# Patient Record
Sex: Female | Born: 1960 | Race: Black or African American | Hispanic: No | Smoking: Never smoker
Health system: Southern US, Community
[De-identification: ages and names within clinical notes are randomized; demographics above are authoritative.]

## PROBLEM LIST (undated history)

## (undated) DIAGNOSIS — I2510 Atherosclerotic heart disease of native coronary artery without angina pectoris: Secondary | ICD-10-CM

## (undated) DIAGNOSIS — E079 Disorder of thyroid, unspecified: Secondary | ICD-10-CM

## (undated) DIAGNOSIS — I10 Essential (primary) hypertension: Secondary | ICD-10-CM

## (undated) HISTORY — PX: TUBAL LIGATION: SHX77

## (undated) HISTORY — PX: ABDOMINAL HYSTERECTOMY: SHX81

## (undated) HISTORY — PX: BREAST BIOPSY: SHX20

## (undated) HISTORY — DX: Disorder of thyroid, unspecified: E07.9

## (undated) HISTORY — DX: Essential (primary) hypertension: I10

---

## 1998-01-05 ENCOUNTER — Other Ambulatory Visit: Admission: RE | Admit: 1998-01-05 | Discharge: 1998-01-05 | Payer: Self-pay | Admitting: Family Medicine

## 1998-01-05 ENCOUNTER — Other Ambulatory Visit: Admission: RE | Admit: 1998-01-05 | Discharge: 1998-01-05 | Payer: Self-pay

## 1998-07-12 ENCOUNTER — Encounter: Payer: Self-pay | Admitting: Obstetrics and Gynecology

## 1998-07-12 ENCOUNTER — Inpatient Hospital Stay (HOSPITAL_COMMUNITY): Admission: AD | Admit: 1998-07-12 | Discharge: 1998-07-12 | Payer: Self-pay | Admitting: Obstetrics and Gynecology

## 1998-08-27 ENCOUNTER — Ambulatory Visit (HOSPITAL_COMMUNITY): Admission: RE | Admit: 1998-08-27 | Discharge: 1998-08-27 | Payer: Self-pay | Admitting: Obstetrics and Gynecology

## 1998-09-02 ENCOUNTER — Inpatient Hospital Stay (HOSPITAL_COMMUNITY): Admission: AD | Admit: 1998-09-02 | Discharge: 1998-09-02 | Payer: Self-pay | Admitting: Obstetrics & Gynecology

## 1998-09-19 ENCOUNTER — Inpatient Hospital Stay (HOSPITAL_COMMUNITY): Admission: AD | Admit: 1998-09-19 | Discharge: 1998-09-19 | Payer: Self-pay | Admitting: Obstetrics and Gynecology

## 1998-09-27 ENCOUNTER — Inpatient Hospital Stay (HOSPITAL_COMMUNITY): Admission: AD | Admit: 1998-09-27 | Discharge: 1998-09-27 | Payer: Self-pay | Admitting: *Deleted

## 1998-10-11 ENCOUNTER — Inpatient Hospital Stay (HOSPITAL_COMMUNITY): Admission: AD | Admit: 1998-10-11 | Discharge: 1998-10-11 | Payer: Self-pay | Admitting: Obstetrics and Gynecology

## 1998-10-12 ENCOUNTER — Inpatient Hospital Stay (HOSPITAL_COMMUNITY): Admission: AD | Admit: 1998-10-12 | Discharge: 1998-10-12 | Payer: Self-pay | Admitting: Obstetrics and Gynecology

## 1998-10-19 ENCOUNTER — Inpatient Hospital Stay (HOSPITAL_COMMUNITY): Admission: AD | Admit: 1998-10-19 | Discharge: 1998-10-22 | Payer: Self-pay | Admitting: *Deleted

## 1998-10-19 ENCOUNTER — Inpatient Hospital Stay (HOSPITAL_COMMUNITY): Admission: AD | Admit: 1998-10-19 | Discharge: 1998-10-19 | Payer: Self-pay | Admitting: Obstetrics and Gynecology

## 1998-10-21 ENCOUNTER — Encounter: Payer: Self-pay | Admitting: *Deleted

## 1998-10-26 ENCOUNTER — Inpatient Hospital Stay (HOSPITAL_COMMUNITY): Admission: AD | Admit: 1998-10-26 | Discharge: 1998-10-26 | Payer: Self-pay | Admitting: Obstetrics and Gynecology

## 1998-10-30 ENCOUNTER — Inpatient Hospital Stay (HOSPITAL_COMMUNITY): Admission: AD | Admit: 1998-10-30 | Discharge: 1998-10-30 | Payer: Self-pay | Admitting: Obstetrics

## 1998-11-05 ENCOUNTER — Observation Stay (HOSPITAL_COMMUNITY): Admission: AD | Admit: 1998-11-05 | Discharge: 1998-11-06 | Payer: Self-pay | Admitting: *Deleted

## 1998-11-05 ENCOUNTER — Inpatient Hospital Stay (HOSPITAL_COMMUNITY): Admission: AD | Admit: 1998-11-05 | Discharge: 1998-11-05 | Payer: Self-pay | Admitting: *Deleted

## 1998-11-07 ENCOUNTER — Inpatient Hospital Stay (HOSPITAL_COMMUNITY): Admission: AD | Admit: 1998-11-07 | Discharge: 1998-11-10 | Payer: Self-pay | Admitting: *Deleted

## 1998-12-14 ENCOUNTER — Other Ambulatory Visit: Admission: RE | Admit: 1998-12-14 | Discharge: 1998-12-14 | Payer: Self-pay | Admitting: Obstetrics & Gynecology

## 1999-02-18 ENCOUNTER — Ambulatory Visit (HOSPITAL_COMMUNITY): Admission: RE | Admit: 1999-02-18 | Discharge: 1999-02-18 | Payer: Self-pay | Admitting: Obstetrics & Gynecology

## 1999-02-24 ENCOUNTER — Emergency Department (HOSPITAL_COMMUNITY): Admission: EM | Admit: 1999-02-24 | Discharge: 1999-02-24 | Payer: Self-pay | Admitting: Emergency Medicine

## 2000-06-27 ENCOUNTER — Emergency Department (HOSPITAL_COMMUNITY): Admission: EM | Admit: 2000-06-27 | Discharge: 2000-06-27 | Payer: Self-pay | Admitting: Emergency Medicine

## 2000-06-27 ENCOUNTER — Encounter: Payer: Self-pay | Admitting: Emergency Medicine

## 2000-12-21 ENCOUNTER — Ambulatory Visit (HOSPITAL_COMMUNITY): Admission: RE | Admit: 2000-12-21 | Discharge: 2000-12-21 | Payer: Self-pay | Admitting: Orthopedic Surgery

## 2000-12-21 ENCOUNTER — Encounter: Payer: Self-pay | Admitting: Orthopedic Surgery

## 2001-10-10 ENCOUNTER — Ambulatory Visit (HOSPITAL_COMMUNITY): Admission: RE | Admit: 2001-10-10 | Discharge: 2001-10-10 | Payer: Self-pay | Admitting: Obstetrics & Gynecology

## 2002-03-27 ENCOUNTER — Ambulatory Visit (HOSPITAL_COMMUNITY): Admission: RE | Admit: 2002-03-27 | Discharge: 2002-03-27 | Payer: Self-pay | Admitting: Obstetrics and Gynecology

## 2002-03-27 ENCOUNTER — Encounter: Payer: Self-pay | Admitting: Obstetrics and Gynecology

## 2002-05-18 ENCOUNTER — Emergency Department (HOSPITAL_COMMUNITY): Admission: EM | Admit: 2002-05-18 | Discharge: 2002-05-18 | Payer: Self-pay | Admitting: Emergency Medicine

## 2002-09-24 ENCOUNTER — Inpatient Hospital Stay (HOSPITAL_COMMUNITY): Admission: AD | Admit: 2002-09-24 | Discharge: 2002-09-24 | Payer: Self-pay | Admitting: Obstetrics and Gynecology

## 2002-09-24 ENCOUNTER — Encounter: Payer: Self-pay | Admitting: Obstetrics and Gynecology

## 2003-01-02 ENCOUNTER — Inpatient Hospital Stay (HOSPITAL_COMMUNITY): Admission: EM | Admit: 2003-01-02 | Discharge: 2003-01-03 | Payer: Self-pay | Admitting: Emergency Medicine

## 2003-01-02 ENCOUNTER — Encounter: Payer: Self-pay | Admitting: Emergency Medicine

## 2007-02-12 ENCOUNTER — Observation Stay (HOSPITAL_COMMUNITY): Admission: AD | Admit: 2007-02-12 | Discharge: 2007-02-14 | Payer: Self-pay | Admitting: Cardiology

## 2007-06-14 ENCOUNTER — Encounter: Admission: RE | Admit: 2007-06-14 | Discharge: 2007-06-14 | Payer: Self-pay | Admitting: Obstetrics & Gynecology

## 2011-01-10 NOTE — Cardiovascular Report (Signed)
NAMEKENORA, SPAYD              ACCOUNT NO.:  [id number]   MEDICAL RECORD NO.:  [id number]          PATIENT TYPE:  INP   LOCATION:  2023                         FACILITY:  [facility]   PHYSICIAN:  Georga Hacking, M.D.DATE OF BIRTH:  06-02-1961   DATE OF PROCEDURE:  02/13/2007  DATE OF DISCHARGE:                            CARDIAC CATHETERIZATION   HISTORY:  [age]-year-old female who presented with prolonged jaw,  arm and chest discomfort that was prolonged.  EKG was unremarkable and  pain was significantly improved with nitroglycerin.  Initial troponin  returned positive at 0.44, but MB was negative.  Catheterization was  advised to exclude significant coronary artery disease.   PROCEDURE:  Left heart catheterization with coronary angiograms and left  ventriculogram.   COMMENTS ABOUT PROCEDURE:  The patient tolerated the procedure well  without complications.  The right femoral artery was entered using a  single anterior needle wall stick.  She was taken to the holding area  for removal of the sheath following the procedure.   HEMODYNAMIC DATA:  Aorta post contrast 126/80.  LV post contrast 126/0-  11.   ANGIOGRAPHIC DATA:   LEFT VENTRICULOGRAM:  Performed in the 30-degree RAO projection.  There  was catheter-induced ventricular ectopy noted during the ventriculogram.  Wall motion appeared normal.  Ejection fraction appeared lower limits of  normal and estimated around 50%.   CORONARY ARTERIES:  Arise and distribute normally.  There was no  coronary calcification noted.  The left main coronary artery was normal.   Left anterior descending:  Arises normally.  It extends to the apex.  There was mild irregularity in proximal and possible mild systolic  bridging noted in the midportion of the LAD; the distal vessel was  moderately tortuous.   Circumflex coronary artery:  Large intermediate branch arises which is  free of disease.  The circumflex has 2 marginal arteries  which were  tortuous and appear free of disease.   The right coronary artery is a codominant vessel with the posterior  descending and a smaller posterolateral branch, which is moderately  tortuous and appears free of disease.   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   RECOMMENDATIONS:  It is possible the patient had an isolated episode of  coronary spasm versus small vessel disease to account for her abnormal  troponin and chest discomfort.  We will treat with a calcium channel  blocker with some negative inotropic effects such as verapamil and treat  with aspirin and Plavix for a few weeks.  Hopefully discharge in the  morning.      Georga Hacking, M.D.  Electronically Signed     WST/MEDQ  D:  02/13/2007  T:  02/14/2007  Job:  [id number]   cc:   Peyton Najjar, MD

## 2011-01-10 NOTE — Discharge Summary (Signed)
[name], [name]              ACCOUNT NO.:  [id number]   MEDICAL RECORD NO.:  [id number]          PATIENT TYPE:  INP   LOCATION:  2023                         FACILITY:  [facility]   PHYSICIAN:  Georga Hacking, M.D.DATE OF BIRTH:  1961/08/17   DATE OF ADMISSION:  02/12/2007  DATE OF DISCHARGE:  02/14/2007                               DISCHARGE SUMMARY   FINAL DIAGNOSES:  1. Prolonged chest discomfort and jaw and arm discomfort consistent      with acute coronary syndrome with mild elevation of troponin.  2. Hypertension.  3. Obesity.  4. Hypothyroidism under treatment.  5. Mild elevation of LDL cholesterol.   PROCEDURES:  CT angiogram of chest, cardiac catheterization, 2-D  echocardiogram.   HISTORY OF PRESENT ILLNESS:  The patient is a 50 year old black female  who has a history of obesity and hypertension for several years.  She  had returned early from a tour of [place] due to a daughter who is pregnant  in high school.  She has normally not had any chest discomfort, but  after cooking dinner the evening prior to admission became somewhat  sweaty and developed a pressure type feeling in her left upper chest  area and had associated discomfort in the left side of her jaw as well  as her shoulder and wrist area.  She felt as if the wrist discomfort was  worse and definitely previous discomfort associated with carpal tunnel  syndrome.  The discomfort may have been mildly pruritic the evening  prior to admission, but it was constant.  At one point considered going  to the emergency room.  The discomfort never completely went away and  was present again the morning of admission when she awoke and she was  found in urgent medical care.  Dr. Alwyn Ren asked me to evaluate her in  the office.  She continued to complain of associated pain in the left  side of her jaw, wrist, shoulder ,and upper chest area but not worse  with exertion.  She has not previously had other cardiac symptoms.   Has  been taking Celebrex for some arthritis.  She was admitted to the  hospital to rule out a myocardial infarction.   PAST MEDICAL HISTORY:  Remarkable for hypertension, obesity, and  hypothyroidism.   PAST SURGERIES:  Tubal ligation.   ALLERGIES:  PENICILLIN.   MEDICATIONS ON ADMISSION:  Celebrex, Synthroid, and HCTZ.   LABORATORY DATA:  Cholesterol 193, LDL cholesterol 130, HDL 49,  triglycerides 70.  CPK 159 with MB 3.7 with troponin 0.44.  Next  troponin was 0.32.  Metabolic panel shows sodium 139, potassium 3.4,  chloride 100, glucose 100.  Normal liver enzymes.  TSH was normal.   CT angiogram of the chest on the day of admission showed mild basilar  atelectasis, mild diffuse fatty infiltration of the liver, no pulmonary  embolus, and aortic dissection.  EKGs were normal.   The patient was admitted and placed on intravenous heparin and given  nitroglycerin which resulted in relief of her discomfort.  Her EKG  remained normal, and her troponin was mildly elevated.  Because relief  of the pain with nitroglycerin as well as elevated troponins she was  taken to the cardiac catheterization laboratory the next morning. There  was no coronary calcification noted.  Coronary arteries were tortuous  and were not felt to contain significant atherosclerotic disease.  There  was no coronary calcification noted.  There was thought to be mild  systolic bridging in the midportion of the LAD, and she also had a small  posterolateral branch off of the right coronary artery.  An  echocardiogram showed ejection fraction with lower limits of normal, and  there was mild LVH.  Valves appeared normal.   The results were discussed with the patient and her husband.  It was  thought because of her elevated troponins that she may have had an  ischemic episode that was not associated with long-term myocardial  damage.  It was thought this could have either been due to coronary  spasm, small vessel  disease or possibility of an event associated  myocardial bridging.   She will be treated with verapamil 240 mg daily to reduce coronary spasm  as well as to help negative inotropic effects for the myocardial bridge.  She is instructed to discontinue Celebrex.  She is to take aspirin 81 mg  daily and was instructed to lose weight and to exercise regularly.  She  may return to work tomorrow.  She was also given a p.r.n. prescription  for nitroglycerin to use.  She should call if she has recurrent  symptoms, and I will see her back in follow-up in the office in one  week.      Georga Hacking, M.D.  Electronically Signed     WST/MEDQ  D:  02/14/2007  T:  02/14/2007  Job:  [id number]   cc:   Peyton Najjar, MD

## 2011-01-13 NOTE — Consult Note (Signed)
NAME:  [name], [name]                        ACCOUNT NO.:  [id number]   MEDICAL RECORD NO.:  [id number]                   PATIENT TYPE:  MAT   LOCATION:  [facility]                                 FACILITY:  [facility]   PHYSICIAN:  Michelle L. Vincente Poli, M.D.            DATE OF BIRTH:  [birth year]   DATE OF CONSULTATION:  09/24/2002  DATE OF DISCHARGE:                                   CONSULTATION   HISTORY:  This is a 50 year old gravida __________  LMP two years ago who  has had a tubal ligation who presents with right lower quadrant pain.  She  has had a history of chronic right lower quadrant pain.  She has had an  examination which involved a laparoscopy in the past which was negative for  endometriosis.  She has also had an IVP which was negative for renal stone.  She has been treated with multiple narcotics in the past.  She says the pain  is intermittent.  She says that sometimes it lasts for a week at a time and  it is always localized to the right lower quadrant.  She does report some  difficulty sometimes with urination.   PHYSICAL EXAMINATION:  VITAL SIGNS:  Temperature 99.0, repeat 98.4, pulse  108, respirations 20, blood pressure 102/61.  ABDOMEN:  Soft.  She has some focal tenderness in right lower quadrant.  No  rebound or guarding.  PELVIC:  External genitalia within normal limits.  Vagina appears normal.  There is scant amount of discharge.  Cervix appears within normal limits.  On bimanual examination there is no cervical motion tenderness.  She has no  tenderness along the levators; however she does have localized tenderness to  the bladder trigone, especially on the right side.  Uterus appeared normal,  mobile, and nontender with no adnexal masses.   LABORATORIES:  Ultrasound is reviewed.  She has a normal sized uterus, a 1  cm right ovarian cyst, and a normal left ovary.  No adnexal mass or free  fluid is noted.  A quantitative hCG is less than 2.  White blood cell  count  is within normal limits with a white blood cell count of 7.9 and a  hemoglobin of 12.1.  Urinalysis is negative.   IMPRESSION:  Chronic pelvic pain.   PLAN:  The patient possibly could have interstitial cystitis.  I recommend  follow-up with a urologist.  Will refer to Jamison Neighbor, M.D. for  evaluation.  In the meantime the patient was discharged home with Tylox as  well as Vioxx 50 mg daily.  A GC, Chlamydia, and wet prep were performed  today.                                               Michelle L.  Vincente Poli, M.D.    Florestine Avers  D:  09/24/2002  T:  09/24/2002  Job:  [id number]

## 2011-01-13 NOTE — Op Note (Signed)
[facility]  Patient:    RIO, TABER Visit Number: [id number] MRN: [id number]          Service Type: Attending:  Freddy Finner, M.D. Dictated by:   Freddy Finner, M.D. Proc. Date: 10/10/01                             Operative Report  PREOPERATIVE DIAGNOSES:       1. Known uterine enlargement.                               2. Persistent right lower quadrant, right                                  pelvic pain.  POSTOPERATIVE DIAGNOSIS:      1. Known uterine enlargement.                               2. Persistent right lower quadrant, right                                  pelvic pain.                               3. No evidence of intrapelvic pathology other                                  than slightly irregular enlargement of the                                  uterus.  OPERATIONS:                   1. Laparoscopy.                               2. Uterosacral nerve ablation.                               3. Coagulation of right proximal tubal stump.                                  Obvious previous surgical sterilization with                                  Filshie clip still in place on the right                                  side with abrasion of the tube, suggesting                                  perhaps that the clip  had abraded off the                                  tube.  SURGEON:                      Freddy Finner, M.D.  ANESTHESIA:                   General.  ESTIMATED BLOOD LOSS:         10 cc.  INTRAOPERATIVE COMPLICATIONS: None.  INDICATIONS:                  The patient is a 50 year old who had previously had surgical sterilization in 2000.  At that time the uterus was noted to be slightly enlarged, consistent with probable leiomyomata.  Subsequent to that time she has been treated with oral contractive for menorrhagia.  In the fairly recent past she presented complaining of persistent right adnexal pelvic  pain.  Pelvic ultrasound revealed no abnormality of either ovary.  It was suspected that she had adhesions and/or pain from the uterus, and she was admitted at this time for laparoscopy.  FINDINGS:                     Basically as noted in the postoperative diagnoses, and photographs were made and retained in the office record.  DESCRIPTION OF PROCEDURE:     The patient was admitted on the morning of surgery, brought to the operating room, and placed under adequate general endotracheal anesthesia, placed in the dorsal lithotomy position using the Allen stirrups.  Betadine prep to the perineum and vagina was carried out in the usual fashion.  The bladder was emptied with a Robinson catheter.  A Hulka tenaculum was attached to the cervix.  Sterile drapes were applied.  Two small incisions were made, one at the umbilicus and one just above the symphysis. An 11 mm trocar was introduced to the umbilicus, and direct inspection revealed adequate placement with no evidence of injury on entry. Pneumoperitoneum was allowed to accumulate with carbon dioxide gas.  A 5 mm trocar was placed under direct visualization through the lower incision.  A blunt probe was used to manipulate pelvic structures.  A systematic examination of the pelvic and abdominal contents was carried out.  Findings were as noted above.  The appendix was noted to be normal.  No apparent abnormality was noted in the upper abdomen.  The uterus was put on stretch with the Hulka tenaculum, and the uterosacrals were fulgurated with bipolar forceps at their junction with the cervix.  The proximal end of the fallopian tube on the right side was fulgurated to a more certain occlusion.  There was no intra-abdominal bleeding.  At this point the procedure was terminated, gas was allowed to escape from the abdomen.  Skin incisions were closed with interrupted subcuticular sutures of 3-0 Dexon, and Steri-Strips were applied through the lower  incision.  Marcaine 0.25% plain was injected into the incision sites for postoperative analgesia.  The patient was given 30 mg of Toradol IV at the conclusion of the procedure.  The patient was awakened and taken to recovery in good condition. Dictated by:   Freddy Finner, M.D. Attending:  Freddy Finner, M.D. DD:  10/10/01 TD:  10/10/01 Job: [id number]

## 2011-01-13 NOTE — Discharge Summary (Signed)
   NAME:  [name], [name]                        ACCOUNT NO.:  [id number]   MEDICAL RECORD NO.:  [id number]                   PATIENT TYPE:  INP   LOCATION:  3743                                 FACILITY:  [facility]   PHYSICIAN:  Noelle C. Merilynn Finland, M.D.           DATE OF BIRTH:  [birth year]   DATE OF ADMISSION:  01/02/2003  DATE OF DISCHARGE:  01/03/2003                                 DISCHARGE SUMMARY   ADDENDUM:   PROBLEM LIST:  1. Continued chest pain, infarct ruled out with enzymes.  Fasting lipid     panel pending at time of dictation.  Patient needs risk stratification as     an outpatient.  2. Hypertension, controlled on HCTZ.  3. Question migraine headache with no triptans in the setting of chest pain.     Continue over-the-counter allergy and sinus medicine and follow up as an     outpatient.   DISPOSITION:  Patient to follow up at [facility] in one to two  weeks.                                               Noelle C. Merilynn Finland, M.D.    NCR/MEDQ  D:  02/18/2003  T:  02/20/2003  Job:  [id number]   cc:   Harrel Lemon. Merla Riches, M.D.  [address]  [place]  [place] [address]  Fax: [phone]    cc:   Harrel Lemon. Merla Riches, M.D.  [address]  [place]  [place] [address]  Fax: [phone]

## 2011-01-13 NOTE — Discharge Summary (Signed)
   NAME:  [name], [name]                        ACCOUNT NO.:  [id number]   MEDICAL RECORD NO.:  [id number]                   PATIENT TYPE:  INP   LOCATION:  3743                                 FACILITY:  [facility]   PHYSICIAN:  Noelle C. Merilynn Finland, M.D.           DATE OF BIRTH:  02-Feb-1961   DATE OF ADMISSION:  01/02/2003  DATE OF DISCHARGE:  01/03/2003                                 DISCHARGE SUMMARY   DISCHARGE DIAGNOSES:  1. Chest pain, ruled out myocardial infarction.  2. Hypertension.  3. Headache.   DISCHARGE MEDICATIONS:  1. Hydrochlorothiazide 12.5 mg p.o. every day.  2. Aspirin 81 mg p.o. every day.  3. Imitrex injector 6 mg p.r.n. migraines.  4. Multivitamin every day.   PRIMARY PHYSICIAN:  [facility], Dr. Merla Riches.   CONSULTS AND PROCEDURES:  None.   BRIEF HOSPITAL COURSE:  This is a 50 year old African American female who  was in her usual state of good health until the morning of admission when  she awakened from sleep with onset of sharp, squeezing, substernal chest  pain, radiating into her left arm, associated with dyspnea and pleuritic  symptoms, as well as nausea and diaphoresis.   PROBLEM #1:  Chest pain.  Cardiac risk factors include hypertension, family  history (her mother died at age 95 of an MI), as well as perimenopausal  state and sedentary lifestyle.   DICTATION ENDED HERE                                               Noelle C. Merilynn Finland, M.D.    NCR/MEDQ  D:  01/03/2003  T:  01/05/2003  Job:  [id number]   cc:   Dr. Merla Riches

## 2011-01-13 NOTE — H&P (Signed)
NAMECLYDETTE, [name]                        ACCOUNT NO.:  [id number]   MEDICAL RECORD NO.:  [id number]                   PATIENT TYPE:  INP   LOCATION:  3743                                 FACILITY:  [facility]   PHYSICIAN:  Ocie Doyne, M.D.                 DATE OF BIRTH:  [birth year]   DATE OF ADMISSION:  01/02/2003  DATE OF DISCHARGE:  01/03/2003                                HISTORY & PHYSICAL   CHIEF COMPLAINT:  My chest hurts and my left arm is tingling.   HISTORY OF PRESENT ILLNESS:  A 50 year old African American female has been  in her usual state of good health until 5 a.m. this morning she was awakened  from sleep by the onset of sharp, squeezing, substernal chest pain which  gradually increased in intensity and radiated down the left arm into her  hand.  She also complained of dyspnea discomfort with deep inspiration,  nausea and diaphoresis.  She complained of tingling in her hands and feet  and was noted by the emergency department staff to be hyperventilating on  arrival.  She denies vertigo, syncope, waterbrash, cough or fever.  Her  husband called 911 and on arrival to the emergency department the chest pain  was a 7 out of 10 in intensity.  After sublingual nitroglycerin her pain  decreased to a 4 out of 10 and then a 2 out of 10.  She continued to have  some discomfort during my exam.  She described several similar episodes  approximately three years ago with the pain, diaphoresis and left arm pain.  She was evaluated in the emergency department but was not admitted and no  further cardiac work up was done at that time.  She did recently undergo an  EKG to evaluate fatigue which was normal  by her report.  She has never had  a cardiac stress test.  Her lipid status is unknown.  Cardiac risk factors  include a sedentary lifestyle, hypertension, perimenopausal and a strong  family history of cardiac disease.  Her mother died at the age of 60 of MI  and her father  has been diagnosed with severe cardiac atherosclerosis.   REVIEW OF SYSTEMS:  As above.  Also she has recently suffered from fatigue  and reports she had a mildly elevated TSH approximately three weeks ago.  She has had three days of mild headache and subjective swelling in her hands  and feet.  Review of systems is otherwise negative.   MEDICATIONS:  1. Hydrochlorothiazide 12.5 mg p.o. daily.  2. Imitrex injector 6 mg subcu p.r.n. headache.  3. Women's One a Day multivitamin.  4. Tylenol p.r.n.   ALLERGIES:  PENICILLIN causes tongue swelling.   PAST MEDICAL HISTORY:  1. She has had hypertension diagnosed one year ago.  2. Occasional migraine headaches, less than one a month.  3. She was recently diagnosed with mild anemia.  4. She is perimenopausal.  Has been amenorrheic for two years.  5. Three spontaneous vaginal deliveries, uncomplicated except for some     preterm contractions.  6. She had a tubal ligation in 2000.  7. She has had no other surgeries or hospitalizations.   SOCIAL HISTORY:  She is married with three children.  She works as a  Personnel officer.  Has a sedentary lifestyle and does not eat well.  She  denies tobacco use ever and rarely drinks alcohol.   FAMILY HISTORY:  Her mother is deceased of a massive MI at the age of 47 and  was hypertensive.  Father has severe coronary atherosclerosis but otherwise  healthy.  One brother is healthy.  Great aunt has diabetes.  Paternal  grandmother and paternal grandfather have hypertension.  One cousin has  renal cancer.  There is no history of stroke and no other pertinent medical  family history.   ADMISSION EXAMINATION:  VITAL SIGNS:  Temp 97.0, blood pressure 111/59,  heart rate 93, respiratory rate 24, oxygen saturation 100% on room air.  GENERAL:  She was in no acute distress.  Alert and pleasant.  HEENT:  Pupils equal, round, reactive to light.  Extraocular movements  intact.  Nose and oropharynx are within normal  limits with no lesions or  erythema.  NECK:  Supple.  No lymphadenopathy.  No thyromegaly.  HEART:  Regular rate and rhythm.  No murmur, rub or gallop.  No carotid  bruits.  LUNGS:  Clear to auscultation.  No wheezes.  No crackles.  ABDOMEN:  Soft, nontender, nondistended.  Normal  bowel sounds.  No masses.  No organomegaly.  MUSCULOSKELETAL:  Strength is 5/5 bilaterally in the upper and lower  extremities.  Chest wall was tender to palpation on the left side and  reproduced the pain that she had complained of earlier.  Lower extremities  trace non-pitting edema at the ankles, very minimal.  No skin lesions were  noted.   ADMISSION LABORATORY:  Sodium 141, potassium 3.6, chloride 110, BUN 9,  creatinine 0.7, glucose 102.  WBCs 4.9, hemoglobin 11.7, platelets 299.  Normal  differential.  D-dimmer 0.78.   Chest film no acute disease.   EKG normal sinus rhythm, no Q waves, no ST/T elevation.   Total CK 158, CK-MB 0.8, troponin I of 0.01.   ASSESSMENT/PLAN:  A 50 year old [place] American female with chest pain,  somewhat atypical in presentation which was relieved with nitroglycerin,  having multiple cardiac risk factors.  1. Chest pain.  Patient will be admitted to telemetry to rule out myocardial     infarction with serial cardiac enzymes and EKG.  At presentation she has     no EKG changes and cardiac enzymes are normal.  Aspirin and nitroglycerin     were given in the emergency department.  She will be on a cardiac diet.     Her risk factors include family history, hypertension and her     perimenopausal status as well as sedentary lifestyle.  The     reproducibility of the pain with chest wall palpation is somewhat     atypical.  We will check a fasting lipid panel in the morning.  If she     rules out for myocardial infarction we will plan to proceed with a    patient risk stratification.  She is a good candidate for exercise stress     test.  Mild elevation in D-dimmer was  noted but her oxygen  saturation on     room air is completely normal.  She was not dyspneic on exam and     pulmonary embolism seems unlikely on the differential.  2. Hypertension.  She is currently at her goal blood pressure on     hydrochlorothiazide.  Will continue current medications and follow her     blood pressure.  If any evidence of cardiac ischemia shows in her lab     tests or by EKG would switch to a beta-blocker for a stronger     cardioprotective effect.  3.     Migraines.  Currently she has a headache due to use of nitroglycerin.  This      is not a migraine but we will make her Imitrex available during     hospitalization.  I would however, be very alert to the possibility of     coronary vasospasm if she develops worsening chest pain when this is     used.                                               Ocie Doyne, M.D.    CY/MEDQ  D:  01/02/2003  T:  01/04/2003  Job:  [id number]   cc:   Harrel Lemon. Merla Riches, M.D.  [address]  [place]  [place] [address]  Fax: [phone]

## 2011-06-14 LAB — CBC
HCT: 36.7
MCHC: 33.4
MCV: 86.8
Platelets: 307
RDW: 13.9
WBC: 9

## 2011-06-14 LAB — LIPID PANEL
Cholesterol: 193
LDL Cholesterol: 130 — ABNORMAL HIGH
VLDL: 14

## 2011-06-14 LAB — TSH: TSH: 3.139

## 2011-06-14 LAB — HEPARIN LEVEL (UNFRACTIONATED): Heparin Unfractionated: 0.89 — ABNORMAL HIGH

## 2011-06-14 LAB — COMPREHENSIVE METABOLIC PANEL
Albumin: 3.7
Alkaline Phosphatase: 90
BUN: 10
Calcium: 9.4
Potassium: 3.4 — ABNORMAL LOW
Total Protein: 7.5

## 2011-06-14 LAB — CARDIAC PANEL(CRET KIN+CKTOT+MB+TROPI)
CK, MB: 3.7
Relative Index: 2.3
Total CK: 124
Total CK: 159
Troponin I: 0.32 — ABNORMAL HIGH

## 2011-06-14 LAB — PROTIME-INR
INR: 1.1
Prothrombin Time: 14.3

## 2011-06-14 LAB — APTT: aPTT: 33

## 2011-08-05 ENCOUNTER — Emergency Department (HOSPITAL_COMMUNITY)
Admission: EM | Admit: 2011-08-05 | Discharge: 2011-08-06 | Discharge status: Against Medical Advice | Payer: BC Managed Care – PPO | Attending: Emergency Medicine | Admitting: Emergency Medicine

## 2011-08-05 ENCOUNTER — Other Ambulatory Visit: Payer: Self-pay

## 2011-08-05 ENCOUNTER — Encounter: Payer: Self-pay | Admitting: *Deleted

## 2011-08-05 DIAGNOSIS — R079 Chest pain, unspecified: Secondary | ICD-10-CM | POA: Insufficient documentation

## 2011-08-05 HISTORY — DX: Atherosclerotic heart disease of native coronary artery without angina pectoris: I25.10

## 2011-08-05 NOTE — ED Notes (Signed)
Pt watching tv and developed midsternal chest pain radiating to left side; diaphoretic prior to arrival; pain with deep inspiration

## 2012-04-25 ENCOUNTER — Encounter: Payer: Self-pay | Admitting: Family Medicine

## 2012-04-25 ENCOUNTER — Ambulatory Visit (INDEPENDENT_AMBULATORY_CARE_PROVIDER_SITE_OTHER): Payer: BC Managed Care – PPO | Admitting: Family Medicine

## 2012-04-25 VITALS — BP 133/87 | HR 99 | Temp 98.1°F | Resp 17 | Ht 64.0 in | Wt 226.0 lb

## 2012-04-25 DIAGNOSIS — F439 Reaction to severe stress, unspecified: Secondary | ICD-10-CM

## 2012-04-25 DIAGNOSIS — Z639 Problem related to primary support group, unspecified: Secondary | ICD-10-CM

## 2012-04-25 DIAGNOSIS — E669 Obesity, unspecified: Secondary | ICD-10-CM

## 2012-04-25 DIAGNOSIS — F4310 Post-traumatic stress disorder, unspecified: Secondary | ICD-10-CM

## 2012-04-25 DIAGNOSIS — I10 Essential (primary) hypertension: Secondary | ICD-10-CM

## 2012-04-25 NOTE — Patient Instructions (Signed)
Here are some of the steps that I recommend to get a weight loss program started-  Increase Effexor to 150 mg to help reduce symptoms of stress and anxiety that are aggravated by current home situation.                                                                                                                                         Get someone from the local [facility] facility to talk with your son and help him address his issues for transition into civilian life.                                                                                                                                          Inquire a the Y about a membership [employer]' discount) so you can get into Water aerobics class. Try to go 2 times a week to start.      Monitor the situations that trigger your stress eating and try to manage portions.     Diabetes Meal Planning Guide The diabetes meal planning guide is a tool to help you plan your meals and snacks. It is important for people with diabetes to manage their blood glucose (sugar) levels. Choosing the right foods and the right amounts throughout your day will help control your blood glucose. Eating right can even help you improve your blood pressure and reach or maintain a healthy weight. CARBOHYDRATE COUNTING MADE EASY When you eat carbohydrates, they turn to sugar. This raises your blood glucose level. Counting carbohydrates can help you control this level so you feel better. When you plan your meals by counting carbohydrates, you can have more flexibility in what you eat and balance your medicine with your food intake. Carbohydrate counting simply means adding up the total amount of carbohydrate grams in your meals and snacks. Try to eat about the same amount at each meal. Foods with carbohydrates are listed below. Each portion below is 1 carbohydrate serving or 15 grams of carbohydrates. Ask your dietician how many grams of carbohydrates you should eat at each meal or  snack. Grains and Starches  1 slice bread.    English muffin or hotdog/hamburger bun.    cup cold cereal (unsweetened).   ? cup cooked pasta or rice.    cup  starchy vegetables (corn, potatoes, peas, beans, winter squash).   1 tortilla (6 inches).    bagel.   1 waffle or pancake (size of a CD).    cup cooked cereal.   4 to 6 small crackers.  *Whole grain is recommended. Fruit  1 cup fresh unsweetened berries, melon, papaya, pineapple.   1 small fresh fruit.    banana or mango.    cup fruit juice (4 oz unsweetened).    cup canned fruit in natural juice or water.   2 tbs dried fruit.   12 to 15 grapes or cherries.  Milk and Yogurt  1 cup fat-free or 1% milk.   1 cup soy milk.   6 oz light yogurt with sugar-free sweetener.   6 oz low-fat soy yogurt.   6 oz plain yogurt.  Vegetables  1 cup raw or  cup cooked is counted as 0 carbohydrates or a "free" food.   If you eat 3 or more servings at 1 meal, count them as 1 carbohydrate serving.  Other Carbohydrates   oz chips or pretzels.    cup ice cream or frozen yogurt.    cup sherbet or sorbet.   2 inch square cake, no frosting.   1 tbs honey, sugar, jam, jelly, or syrup.   2 small cookies.   3 squares of graham crackers.   3 cups popcorn.   6 crackers.   1 cup broth-based soup.   Count 1 cup casserole or other mixed foods as 2 carbohydrate servings.   Foods with less than 20 calories in a serving may be counted as 0 carbohydrates or a "free" food.  You may want to purchase a book or computer software that lists the carbohydrate gram counts of different foods. In addition, the nutrition facts panel on the labels of the foods you eat are a good source of this information. The label will tell you how big the serving size is and the total number of carbohydrate grams you will be eating per serving. Divide this number by 15 to obtain the number of carbohydrate servings in a portion.  Remember, 1 carbohydrate serving equals 15 grams of carbohydrate. SERVING SIZES Measuring foods and serving sizes helps you make sure you are getting the right amount of food. The list below tells how big or small some common serving sizes are.  1 oz.........4 stacked dice.   3 oz........Marland KitchenDeck of cards.   1 tsp.......Marland KitchenTip of little finger.   1 tbs......Marland KitchenMarland KitchenThumb.   2 tbs.......Marland KitchenGolf ball.    cup......Marland KitchenHalf of a fist.   1 cup.......Marland KitchenA fist.  SAMPLE DIABETES MEAL PLAN Below is a sample meal plan that includes foods from the grain and starches, dairy, vegetable, fruit, and meat groups. A dietician can individualize a meal plan to fit your calorie needs and tell you the number of servings needed from each food group. However, controlling the total amount of carbohydrates in your meal or snack is more important than making sure you include all of the food groups at every meal. You may interchange carbohydrate containing foods (dairy, starches, and fruits). The meal plan below is an example of a 2000 calorie diet using carbohydrate counting. This meal plan has 17 carbohydrate servings. Breakfast  1 cup oatmeal (2 carb servings).    cup light yogurt (1 carb serving).   1 cup blueberries (1 carb serving).    cup almonds.  Snack  1 large apple (2 carb servings).   1 low-fat string cheese stick.  Lunch  Chicken breast salad.   1 cup spinach.    cup chopped tomatoes.   2 oz chicken breast, sliced.   2 tbs low-fat [place] dressing.   12 whole-wheat crackers (2 carb servings).   12 to 15 grapes (1 carb serving).   1 cup low-fat milk (1 carb serving).  Snack  1 cup carrots.    cup hummus (1 carb serving).  Dinner  3 oz broiled salmon.   1 cup brown rice (3 carb servings).  Snack  1  cups steamed broccoli (1 carb serving) drizzled with 1 tsp olive oil and lemon juice.   1 cup light pudding (2 carb servings).  DIABETES MEAL PLANNING WORKSHEET Your dietician can  use this worksheet to help you decide how many servings of foods and what types of foods are right for you.  BREAKFAST Food Group and Servings / Carb Servings Grain/Starches __________________________________ Dairy __________________________________________ Vegetable ______________________________________ Fruit ___________________________________________ Meat __________________________________________ Fat ____________________________________________ LUNCH Food Group and Servings / Carb Servings Grain/Starches ___________________________________ Dairy ___________________________________________ Fruit ____________________________________________ Meat ___________________________________________ Fat _____________________________________________ Laural Golden Food Group and Servings / Carb Servings Grain/Starches ___________________________________ Dairy ___________________________________________ Fruit ____________________________________________ Meat ___________________________________________ Fat _____________________________________________ SNACKS Food Group and Servings / Carb Servings Grain/Starches ___________________________________ Dairy ___________________________________________ Vegetable _______________________________________ Fruit ____________________________________________ Meat ___________________________________________ Fat _____________________________________________ DAILY TOTALS Starches _________________________ Vegetable ________________________ Fruit ____________________________ Dairy ____________________________ Meat ____________________________ Fat ______________________________ Document Released: 05/11/2005 Document Revised: 08/03/2011 Document Reviewed: 03/22/2009 [employer] Patient Information 2012 [place], [place].

## 2012-04-25 NOTE — Progress Notes (Signed)
S: This 52 y.o. 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 She would like to discuss weight loss, having struggled with  this since 2008 which is when she left the [employer].  Weight Watchers and OTC weight loss supplements have not been effective. Exercise is limited due to knee and ankle injuries.  She has talked with a nutritionist and understands portions ,   caloric intake, etc. Admits to being a "stress eater".    She has significant stressors at home with in complicating her efforts at weight loss. 23 y.o. son is > 1 year out of the [employer] and has mild PTSD (not in treatment, not employed and living with pt); also has adult daughter and her child living with  her  in an apartment which was initially meant for pt and her 21 y.o.son. She works as an Production designer, theatre/television/film with [employer].  ROS: No activity or appetite change other than outlined above; no fever or chills, CP or tightness, palpitations, SOB, edema, nausea or vomiting, change in stools, polyuria or polydipsia, HA, myalgias, tremor,numbness or weakness.           + anxiety, dysphoric mood, sleep disturbance.  O: Filed Vitals:   04/25/12 1403  BP: 133/87  Pulse: 99  Temp: 98.1 F (36.7 C)  Resp: 17  Weight unchanged from 1 year ago  GEN: In NAD HENT: [place]/AT; EOMMI, conj/scl clear. LUNGS: Normal resp rate and effort. COR: RRR NEURO: A&O x 3; CNs intact ; no deficits.  A/P: 1. Obesity, Class II, BMI 35-39.9     Advised joining aquatic exercise program; identify stress eating when it happens (keeping a food diary or journalling may help); observe sound nutrition practices  2. Post traumatic stress disorder (PTSD)      [facility]   3. Stress At Home      Contact local [facility] and find a mentor to intervene with son re: transition to civilian life (clear goals need to be  set by an objective outsider/ veteran and son needs to get into counseling to deal with PSTD  4. HTN (hypertension)    Continue current medication; encourage weight reduction in slow,reasonable responsible manner

## 2012-04-29 ENCOUNTER — Encounter: Payer: Self-pay | Admitting: Family Medicine

## 2012-04-29 DIAGNOSIS — E039 Hypothyroidism, unspecified: Secondary | ICD-10-CM | POA: Insufficient documentation

## 2012-04-29 DIAGNOSIS — F4310 Post-traumatic stress disorder, unspecified: Secondary | ICD-10-CM | POA: Insufficient documentation

## 2012-04-29 DIAGNOSIS — E66812 Obesity, class 2: Secondary | ICD-10-CM | POA: Insufficient documentation

## 2012-04-29 DIAGNOSIS — I10 Essential (primary) hypertension: Secondary | ICD-10-CM | POA: Insufficient documentation

## 2012-04-29 DIAGNOSIS — E669 Obesity, unspecified: Secondary | ICD-10-CM | POA: Insufficient documentation

## 2012-04-29 DIAGNOSIS — F439 Reaction to severe stress, unspecified: Secondary | ICD-10-CM | POA: Insufficient documentation

## 2012-04-29 DIAGNOSIS — G4733 Obstructive sleep apnea (adult) (pediatric): Secondary | ICD-10-CM | POA: Insufficient documentation

## 2012-05-23 ENCOUNTER — Encounter: Payer: Self-pay | Admitting: Family Medicine

## 2012-08-01 ENCOUNTER — Ambulatory Visit: Payer: BC Managed Care – PPO | Admitting: Family Medicine

## 2012-09-09 ENCOUNTER — Ambulatory Visit (INDEPENDENT_AMBULATORY_CARE_PROVIDER_SITE_OTHER): Payer: BC Managed Care – PPO | Admitting: Family Medicine

## 2012-09-09 VITALS — BP 136/92 | HR 109 | Temp 98.7°F | Resp 18 | Wt 232.0 lb

## 2012-09-09 DIAGNOSIS — K5289 Other specified noninfective gastroenteritis and colitis: Secondary | ICD-10-CM

## 2012-09-09 DIAGNOSIS — K529 Noninfective gastroenteritis and colitis, unspecified: Secondary | ICD-10-CM

## 2012-09-09 MED ORDER — METRONIDAZOLE 500 MG PO TABS: 500.0000 mg | ORAL_TABLET | Freq: Two times a day (BID) | ORAL | Status: DC

## 2012-09-09 NOTE — Progress Notes (Signed)
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  No other sx.  Objective:  NAD Abdomen: soft, nontender with no HSM, hyperactive BS Chest:  Clear Heart: reg, no murmur Skin:  Normal  Assessment:  norovirus type illness  1. Gastroenteritis  metroNIDAZOLE (FLAGYL) 500 MG tablet

## 2012-09-09 NOTE — Patient Instructions (Addendum)
Get some probiotics (eg activia, culturelle, align) Avoid greasy or fried food or dairy products for 2 days   Norovirus Infection Norovirus illness is caused by a viral infection. The term norovirus refers to a group of viruses. Any of those viruses can cause norovirus illness. This illness is often referred to by other names such as viral gastroenteritis, stomach flu, and food poisoning. Anyone can get a norovirus infection. People can have the illness multiple times during their lifetime. CAUSES  Norovirus is found in the stool or vomit of infected people. It is easily spread from person to person (contagious). People with norovirus are contagious from the moment they begin feeling ill. They may remain contagious for as long as 3 days to 2 weeks after recovery. People can become infected with the virus in several ways. This includes:  Eating food or drinking liquids that are contaminated with norovirus.  Touching surfaces or objects contaminated with norovirus, and then placing your hand in your mouth.  Having direct contact with a person who is infected and shows symptoms. This may occur while caring for someone with illness or while sharing foods or eating utensils with someone who is ill. SYMPTOMS  Symptoms usually begin 1 to 2 days after ingestion of the virus. Symptoms may include:  Nausea.  Vomiting.  Diarrhea.  Stomach cramps.  Low-grade fever.  Chills.  Headache.  Muscle aches.  Tiredness. Most people with norovirus illness get better within 1 to 2 days. Some people become dehydrated because they cannot drink enough liquids to replace those lost from vomiting and diarrhea. This is especially true for young children, the elderly, and others who are unable to care for themselves. DIAGNOSIS  Diagnosis is based on your symptoms and exam. Currently, only state public health laboratories have the ability to test for norovirus in stool or vomit. TREATMENT  No specific  treatment exists for norovirus infections. No vaccine is available to prevent infections. Norovirus illness is usually brief in healthy people. If you are ill with vomiting and diarrhea, you should drink enough water and fluids to keep your urine clear or pale yellow. Dehydration is the most serious health effect that can result from this infection. By drinking oral rehydration solution (ORS), people can reduce their chance of becoming dehydrated. There are many commercially available pre-made and powdered ORS designed to safely rehydrate people. These may be recommended by your caregiver. Replace any new fluid losses from diarrhea or vomiting with ORS as follows:  If your child weighs 10 kg or less (22 lb or less), give 60 to 120 ml ( to  cup or 2 to 4 oz) of ORS for each diarrheal stool or vomiting episode.  If your child weighs more than 10 kg (more than 22 lb), give 120 to 240 ml ( to 1 cup or 4 to 8 oz) of ORS for each diarrheal stool or vomiting episode. HOME CARE INSTRUCTIONS   Follow all your caregiver's instructions.  Avoid sugar-free and alcoholic drinks while ill.  Only take over-the-counter or prescription medicines for pain, vomiting, diarrhea, or fever as directed by your caregiver. You can decrease your chances of coming in contact with norovirus or spreading it by following these steps:  Frequently wash your hands, especially after using the toilet, changing diapers, and before eating or preparing food.  Carefully wash fruits and vegetables. Cook shellfish before eating them.  Do not prepare food for others while you are infected and for at least 3 days after recovering from  illness.  Thoroughly clean and disinfect contaminated surfaces immediately after an episode of illness using a bleach-based household cleaner.  Immediately remove and wash clothing or linens that may be contaminated with the virus.  Use the toilet to dispose of any vomit or stool. Make sure the  surrounding area is kept clean.  Food that may have been contaminated by an ill person should be discarded. SEEK IMMEDIATE MEDICAL CARE IF:   You develop symptoms of dehydration that do not improve with fluid replacement. This may include:  Excessive sleepiness.  Lack of tears.  Dry mouth.  Dizziness when standing.  Weak pulse. Document Released: 11/04/2002 Document Revised: 11/06/2011 Document Reviewed: 12/06/2009 [employer] Patient Information 2013 [place], [place].

## 2013-02-06 ENCOUNTER — Emergency Department (HOSPITAL_COMMUNITY): Payer: BC Managed Care – PPO

## 2013-02-06 ENCOUNTER — Emergency Department (HOSPITAL_COMMUNITY)
Admission: EM | Admit: 2013-02-06 | Discharge: 2013-02-06 | Disposition: A | Discharge status: Home / Self Care | Payer: BC Managed Care – PPO | Attending: Emergency Medicine | Admitting: Emergency Medicine

## 2013-02-06 ENCOUNTER — Encounter (HOSPITAL_COMMUNITY): Payer: Self-pay | Admitting: Adult Health

## 2013-02-06 DIAGNOSIS — R079 Chest pain, unspecified: Secondary | ICD-10-CM | POA: Insufficient documentation

## 2013-02-06 DIAGNOSIS — M940 Chondrocostal junction syndrome [Tietze]: Secondary | ICD-10-CM | POA: Insufficient documentation

## 2013-02-06 DIAGNOSIS — I10 Essential (primary) hypertension: Secondary | ICD-10-CM | POA: Insufficient documentation

## 2013-02-06 LAB — BASIC METABOLIC PANEL
BUN: 13 mg/dL (ref 6–23)
Creatinine, Ser: 0.92 mg/dL (ref 0.50–1.10)
GFR calc Af Amer: 83 mL/min — ABNORMAL LOW (ref >90)
GFR calc non Af Amer: 71 mL/min — ABNORMAL LOW (ref >90)
Glucose, Bld: 128 mg/dL — ABNORMAL HIGH (ref 70–99)

## 2013-02-06 LAB — CBC
HCT: 36.9 % (ref 36.0–46.0)
Hemoglobin: 12.5 g/dL (ref 12.0–15.0)
MCH: 29.8 pg (ref 26.0–34.0)
MCHC: 33.9 g/dL (ref 30.0–36.0)
MCV: 88.1 fL (ref 78.0–100.0)
RDW: 14.1 % (ref 11.5–15.5)

## 2013-02-06 LAB — POCT I-STAT TROPONIN I

## 2013-02-06 MED ORDER — MORPHINE SULFATE 4 MG/ML IJ SOLN
4.0000 mg | Freq: Once | INTRAMUSCULAR | Status: AC
  Administered 2013-02-06: 4 mg via INTRAVENOUS
  Filled 2013-02-06: qty 1

## 2013-02-06 MED ORDER — SODIUM CHLORIDE 0.9 % IV BOLUS (SEPSIS)
500.0000 mL | Freq: Once | INTRAVENOUS | Status: AC
  Administered 2013-02-06: 500 mL via INTRAVENOUS

## 2013-02-06 MED ORDER — ONDANSETRON HCL 4 MG/2ML IJ SOLN
4.0000 mg | Freq: Once | INTRAMUSCULAR | Status: AC
  Administered 2013-02-06: 4 mg via INTRAVENOUS
  Filled 2013-02-06: qty 2

## 2013-02-06 MED ORDER — ASPIRIN 325 MG PO TABS
325.0000 mg | ORAL_TABLET | ORAL | Status: AC
  Administered 2013-02-06: 325 mg via ORAL
  Filled 2013-02-06: qty 1

## 2013-02-06 MED ORDER — CYCLOBENZAPRINE HCL 10 MG PO TABS: 10.0000 mg | ORAL_TABLET | Freq: Two times a day (BID) | ORAL | Status: DC | PRN

## 2013-02-06 MED ORDER — IOHEXOL 350 MG/ML SOLN
100.0000 mL | Freq: Once | INTRAVENOUS | Status: AC | PRN
  Administered 2013-02-06: 100 mL via INTRAVENOUS

## 2013-02-06 MED ORDER — HYDROCODONE-ACETAMINOPHEN 5-325 MG PO TABS: 1.0000 | ORAL_TABLET | Freq: Three times a day (TID) | ORAL | Status: DC | PRN

## 2013-02-06 NOTE — ED Notes (Signed)
Melvenia Beam PA and Dr. Ignacia Palma at bedside.

## 2013-02-06 NOTE — ED Notes (Signed)
Pt called out sts having severe left sided chest pain radiating to left arm. Pt sts feeling lightheaded, denies SOB, nausea. Worsens with deep inspiration. Sharai PA made aware. Repeat EKG done adn 4mg  of morphine given.

## 2013-02-06 NOTE — Discharge Instructions (Signed)
Please call and set-up an appointment with Cardiology regarding chest pain Please call and set-up an appointment with Adult Care Clinic by the beginning of next week to be re-evaluated Please take medications as prescribed - while on pain medications there is to be no drinking alcohol, driving, operating heavy machinery - if there is any left over please flush down the toilet Please continue to rest and stay hydrated Please continue to monitor symptoms and if symptoms are to worsen or change (fever > 100, chills, sweating, worsening or changes to chest pain, increase in chest pain, numbness and/or tingling, dizziness, nausea, vomiting, stomach pain) please report back to the ED.   Chest Pain (Nonspecific) Chest pain has many causes. Your pain could be caused by something serious, such as a heart attack or a blood clot in the lungs. It could also be caused by something less serious, such as a chest bruise or a virus. Follow up with your doctor. More lab tests or other studies may be needed to find the cause of your pain. Most of the time, nonspecific chest pain will improve within 2 to 3 days of rest and mild pain medicine. HOME CARE  For chest bruises, you may put ice on the sore area for 15-20 minutes, 3-4 times a day. Do this only if it makes you or your child feel better.  Put ice in a plastic bag.  Place a towel between the skin and the bag.  Rest for the next 2 to 3 days.  Go back to work if the pain improves.  See your doctor if the pain lasts longer than 1 to 2 weeks.  Only take medicine as told by your doctor.  Quit smoking if you smoke. GET HELP RIGHT AWAY IF:   There is more pain or pain that spreads to the arm, neck, jaw, back, or belly (abdomen).  You or your child has shortness of breath.  You or your child coughs more than usual or coughs up blood.  You or your child has very bad back or belly pain, feels sick to his or her stomach (nauseous), or throws up  (vomits).  You or your child has very bad weakness.  You or your child passes out (faints).  You or your child has a temperature by mouth above 102 F (38.9 C), not controlled by medicine. Any of these problems may be serious and may be an emergency. Do not wait to see if the problems will go away. Get medical help right away. Call your local emergency services 911 in [place] Do not drive yourself to the hospital. MAKE SURE YOU:   Understand these instructions.  Will watch this condition.  Will get help right away if you or your child is not doing well or gets worse. Document Released: 01/31/2008 Document Revised: 11/06/2011 Document Reviewed: 01/31/2008 [facility] Patient Information 2014 [place], [place]. Costochondritis Costochondritis (Tietze syndrome), or costochondral separation, is a swelling and irritation (inflammation) of the tissue (cartilage) that connects your ribs with your breastbone (sternum). It may occur on its own (spontaneously), through damage caused by an accident (trauma), or simply from coughing or minor exercise. It may take up to 6 weeks to get better and longer if you are unable to be conservative in your activities. HOME CARE INSTRUCTIONS   Avoid exhausting physical activity. Try not to strain your ribs during normal activity. This would include any activities using chest, belly (abdominal), and side muscles, especially if heavy weights are used.  Use ice  for 15-20 minutes per hour while awake for the first 2 days. Place the ice in a plastic bag, and place a towel between the bag of ice and your skin.  Only take over-the-counter or prescription medicines for pain, discomfort, or fever as directed by your caregiver. SEEK IMMEDIATE MEDICAL CARE IF:   Your pain increases or you are very uncomfortable.  You have a fever.  You develop difficulty with your breathing.  You cough up blood.  You develop worse chest pains, shortness of breath, sweating, or  vomiting.  You develop new, unexplained problems (symptoms). MAKE SURE YOU:   Understand these instructions.  Will watch your condition.  Will get help right away if you are not doing well or get worse. Document Released: 05/24/2005 Document Revised: 11/06/2011 Document Reviewed: 04/01/2008 [facility] Patient Information 2014 [place], [place].  RESOURCE GUIDE  Chronic Pain Problems: Contact [facility]  [phone] Patients need to be referred by their primary care doctor.  Insufficient Money for Medicine: Contact United Way:  call "211."   No Primary Care Doctor: - Call Health Connect  [phone] - can help you locate a primary care doctor that  accepts your insurance, provides certain services, etc. - Physician Referral Service- [phone]  Agencies that provide inexpensive medical care: - [facility]  [phone] - [facility]  [phone] - [facility]  [phone] - Women's Clinic  [phone] - Planned Parenthood  [phone] [facility]  [phone]  Medicaid-accepting [facility] Providers: - [facility]- [address]  [phone], Mon-Fri 9am-7pm, Sat 9am-1pm - [facility]- [address] [place], [address] [place]  [phone] - [facility]- [address], Suite [place]  [phone] [facility]- [address]  [phone] - Renaye Rakers- [address] [place], [address], [phone]  Only accepts [place] patients after they have their name  applied to their card  Self Pay (no insurance) in [place]: - Sickle Cell Patients - [facility]  [address] [place], [phone] - [facility] Urgent Care- [address] [place]  [phone]       [facility] [place]- [address]       -     [facility]- see information above (Speak to [employer] if you do not have  insurance)       -  [facility]- [address],  [phone]       -  [facility]- [address] [phone]       -  Dr Julio Sicks-  [address], [place], [phone]       -  Urgent Medical and [facility] - [address], [phone]       -  [facility]- [address], [phone], also [address], [phone]       -     [facility]- [address] [place], [phone], 1st & 3rd Saturday         every month, 10am-1pm  -     [facility] and [facility]   [address] [place], [place].   Phone:  [phone], Fax:  [phone]. Hours of Operation:  9 am - 6 pm, M-F.  -     [facility]   [address], [place]   Phone: [phone], Fax: [phone]. Hours of Operation:  8:30 am - 5:30 pm, M-F.  [facility] [address] [place], [place] [address] [phone]  [facility] [address] [place], [place] [address] [phone]  1) Find a Doctor and Pay Out of Pocket Although you won't have to find out who is covered by your insurance plan, it is a good idea to ask around and get recommendations. You will then need to call the office and see if the doctor you have chosen will accept you as a new patient and what types of options they offer for patients who are self-pay. Some doctors offer discounts or will set up payment plans for their patients who do not have insurance, but you will need to ask so you aren't surprised when you get to your appointment.  2) Contact Your Local Health Department Not all health departments have doctors that can see patients for sick visits, but many do, so it is worth a call to see if yours does. If you don't know where your local health department is, you can check in your phone book. The CDC also has a tool to help you locate your state's health department, and many state websites also have listings of all  of their local health departments.  3) Find a Walk-in Clinic If your illness is not likely to be very severe or complicated, you may want to try a walk in clinic. These are popping up all over the country in pharmacies, drugstores, and shopping centers. They're usually staffed by nurse practitioners or physician assistants that have been trained to treat common illnesses and complaints. They're usually fairly quick and inexpensive. However, if you have serious medical issues or chronic medical problems, these are probably not your best option  STD Testing - [facility] Department of [facility] Donna, STD Clinic, [address] [place] [address], [place], phone [phone] or [phone].  Monday - Friday, call for an appointment. [facility] Department of [employer], STD Clinic, [place] [address], [place], phone [phone] or [phone].  Monday - Friday, call for an appointment.  Abuse/Neglect: [facility] Child Abuse Hotline (229) 727-2605 [facility] Child Abuse Hotline [phone] (After Hours)  Emergency Shelter:  [facility] [phone]  Maternity Homes: - Room at the [place] [facility] [phone] - [facility] [phone]  MRSA Hotline #:   267 150 5076  Dental Assistance If unable to pay or uninsured, contact:  [facility]. to become qualified for the adult dental clinic.  Patients with Medicaid: [facility] [phone] [address], [phone] [address] [phone]  If unable to pay, or uninsured, contact [facility] [phone] in [place], [phone] in [facility]) to become qualified for the adult dental clinic  [facility] [address] [place], [place] [address] [phone] [email or web address]  Other Proofreader Services: - [facility]- [address] [place], [place], [place], [address], [phone], Ext. 123, 2nd  and 4th Thursday of the month at 6:30am.  10 clients each day by appointment, can sometimes see walk-in patients if someone does not show for an appointment. [facility]- [address] Ether Griffins [place], [place], [address], [phone] - [facility]- [address] [place], [place], [address], [phone] [facility] Health Department- [phone] - Berton Lan  [employer] Health Department- [phone] [facility] [phone]       Behavioral Health Resources in the [facility]  Intensive Outpatient Programs: [facility]      [address] [place], [place] [phone] Both a day and evening program       [facility] Outpatient     [address]        [place], [place] [address] [phone]         ADS: [facility] Drug [facility] [address] [place] [phone]  [facility] Mental Health ACCESS LINE: [phone] or [phone] [address] [place], [place] [address] [email or web address]   Substance Abuse Resources: - Alcohol and Drug Services  (513) [phone] - [facility] [phone] - [facility] [place] [phone]) [phone] Floydene Flock (913)[phone] - Residential & Outpatient Substance Abuse Program  ([phone]) [phone]  Psychological Services: [facility]  [phone] [facility] Services  [phone] - [facility], [phone] [place]. [address] [place], ACCESS LINE: [phone] or [phone], [email or web address]  Mobile Crisis Teams:                                        Therapeutic Alternatives         Mobile Crisis Care Unit [phone]             [facility] [address] Ginette Otto [phone]                                         Interventionist [address] [place] [phone]  Self-Help/Support  Groups: Mental Health Assoc. of [employer] of support groups [phone] (call for more info)  Narcotics Anonymous (NA) Caring Services [address] [place] - 2 meetings at this location  Residential Treatment Programs:  [facility]      [address]        [place]       [phone]         [facility] [address] [place] [address] [place], [place]  [address] [phone]  [facility] Treatment Facility  [address] [place], [place] [address] [phone] Admissions: 8am-3pm M-F  [facility]     [address]        [place], [place] [address]       [phone]         [facility] [address] [place]ling Manns [place], [place] [phone]  [facility] [address] [place], [place] [phone]  [facility] - Intensive Outpatient      [address]     [place], [place]       [phone]         [facility] ([facility]     [address] [place], [place] [phone] or [phone]  Residential Treatment Services (RTS), Medicaid [address] [place], [place] [phone]  [facility] A[facility]                                               [address] [place]  [place] [phone]  [facility] Resources: [facility] [place]- [phone]               General Therapy                                                Angie Fava, PhD        [address] [place], [place] [address]         [phone]   Insurance  [facility] Behavioral   [address] [place], [place] [address] [phone]  [facility] [address] [place], [place] [address] [phone] Insurance/Medicaid/sponsorship through [facility] and Families                                              [address]                                        [place], [place]  [address]    Therapy/tele-psych/case         [phone]          [facility] [address] [place], [place]  [address]  Adolescent/group home/case management [phone]                                           Creola Corn PhD       General therapy       Insurance   [phone]         Dr. Lolly Mustache, Insurance, M-F [phone]  [facility]  United Way [facility] Dept. [address]                 [address]         [address] [place] [address]  [place] Reveal Phone:  [phone]                                  Phone:  [phone]                   Phone:  [phone]  [facility], [phone] - [phone]       -     [facility] in  [place] [address],             [phone], Insurance  [place] Child Abuse Hotline [phone] or [phone] (After Hours)

## 2013-02-06 NOTE — ED Provider Notes (Signed)
Transfer of care from Elpidio Anis, PA-C at 6:30PM - discussed case. CT Angio of chest ordered to rule out PE and AAA.  [name] is a 52 y/o F with PMHx of CAD and HTN presenting to the ED with ongoing chest pain that started this morning with radiation to the left arm. Associated symptoms are SOB and light-headedness.  CBC negative findings BMP negative findings Troponins negative  Chest xray negative findings CT Angio chest - no evidence of PE, thoracic aorta shows no evidence of aneurysmal disease or dissection   8:28PM Cardiac cath performed in 2008 - as stated per patient. Stated that the chest pain continues, described as a pressure to the chest associated with difficulty breathing and main increases when she takes a deep inhalation.  Alert and oriented x 3. Cardiac: Normal rhythm and rate. Pulses 2+ to radial and DP bilaterally. Lungs: Clear to auscultation bilaterally. Chest wall tenderness, reproducible upon palpation, mainly to the left side of the chest.  MS: Full ROM. Strength to 5+/5+ to upper extremities bilaterally  Patient felt nauseous - Zofran 4 mg IV given and 500 ml NS    Date: 02/06/2013  Rate: 90  Rhythm: normal sinus rhythm  QRS Axis: normal  Intervals: normal  ST/T Wave abnormalities: nonspecific ST/T changes  Conduction Disutrbances:none  Narrative Interpretation:   Old EKG Reviewed: unchanged compared to 07/2010   10:10PM Patient reassessed - patient reported that she is feeling better and that she would like to go home. Discussed case and labs with Dr. Adline Mango - cleared patient for discharge. Negative PE and AAA findings on CT Angio of Chest. CBC, BMP, EKG, troponins negative findings. Chest pain suspicion to be costochondritis possibly - true etiology unknown. Patient stable, afebrile. Discharged patient. Discharged patient with pain medications, small, dose and muscle relaxers - discussed with patient course, precautions, disposal technique.  Discussed with patient to rest and hydrated. Referred patient to cardiology and adult care clinic. Discussed with patient to continue to monitor symptoms and if symptoms are to worsen or change to report back to the ED - strict return instructions given.  Patient agreed to plan of care, understood, all questions answered.   Raymon Mutton, PA-C 02/07/13 [phone]

## 2013-02-06 NOTE — Progress Notes (Signed)
6:40 PM  Date: 02/06/2013  Rate: 90  Rhythm: normal sinus rhythm  QRS Axis: normal  Intervals: normal  ST/T Wave abnormalities: normal  Conduction Disutrbances:none  Narrative Interpretation: Normal EKG  Old EKG Reviewed: changes noted--rate slower than on prior tracign appx 4 hours ago.

## 2013-02-06 NOTE — ED Notes (Signed)
CT called- sts patient has been called for.

## 2013-02-06 NOTE — ED Notes (Signed)
Presents with left sided chest pressure that began when she woke up this am pain has increased in intensity over the course of the day.   Associated with SOB, lightheadedness. Pain radiates to left arm. Nothing makes pain better. Deep inspiration makes pain worse.

## 2013-02-07 NOTE — ED Provider Notes (Signed)
Medical screening examination/treatment/procedure(s) were conducted as a shared visit with non-physician practitioner(s) and myself.  I personally evaluated the patient during the encounter 52 yo woman with left upper chest pain radiating to left arm.  Her exam showed significant left upper chest wall tenderness.  Her workup included negative cardiac markers, negative EKG, and negative CT angio of the chest.  Reassured and released.  Dx Chest wall pain.  Carleene Cooper III, MD 02/07/13 [phone]

## 2013-02-28 NOTE — ED Provider Notes (Signed)
History    CSN: [id number] Arrival date & time 02/06/13  1514  First MD Initiated Contact with Patient 02/06/13 1729     Chief Complaint  Patient presents with  . Chest Pain   (Consider location/radiation/quality/duration/timing/severity/associated sxs/prior Treatment) Patient is a 52 y.o. female presenting with chest pain. The history is provided by the patient. No language interpreter was used.  Chest Pain Pain location:  L chest Pain quality: aching   Pain radiates to:  L arm Pain radiates to the back: no   Pain severity:  Moderate Duration:  8 hours Timing:  Constant Worsened by:  Deep breathing Associated symptoms: shortness of breath   Associated symptoms: no abdominal pain, no back pain, no cough, no fever, no headache, no nausea, not vomiting and no weakness   Associated symptoms comment:  Left sided chest pain that is constant since waking this morning. No N, V. She has had shortness of breath. No cough or fever. No history of similar symptoms previously.   Past Medical History  Diagnosis Date  . Coronary artery disease    Past Surgical History  Procedure Laterality Date  . Tubal ligation     History reviewed. No pertinent family history. History  Substance Use Topics  . Smoking status: Never Smoker   . Smokeless tobacco: Not on file  . Alcohol Use: No   OB History   Grav Para Term Preterm Abortions TAB SAB Ect Mult Living                 Review of Systems  Constitutional: Negative for fever.  Respiratory: Positive for shortness of breath. Negative for cough.   Cardiovascular: Positive for chest pain.  Gastrointestinal: Negative for nausea, vomiting and abdominal pain.  Genitourinary: Negative for dysuria.  Musculoskeletal: Negative for back pain.  Neurological: Negative for weakness and headaches.    Allergies  Penicillins  Home Medications   Current Outpatient Rx  Name  Route  Sig  Dispense  Refill  . carvedilol (COREG) 25 MG tablet    Oral   Take 25 mg by mouth 2 (two) times daily with a meal.           . cholecalciferol (VITAMIN D) 1000 UNITS tablet   Oral   Take 1,000 Units by mouth daily.           Marland Kitchen levothyroxine (SYNTHROID, LEVOTHROID) 50 MCG tablet   Oral   Take 50 mcg by mouth daily.           Marland Kitchen venlafaxine XR (EFFEXOR-XR) 75 MG 24 hr capsule   Oral   Take 75 mg by mouth at bedtime.         . cyclobenzaprine (FLEXERIL) 10 MG tablet   Oral   Take 1 tablet (10 mg total) by mouth 2 (two) times daily as needed for muscle spasms.   10 tablet   0   . HYDROcodone-acetaminophen (NORCO) 5-325 MG per tablet   Oral   Take 1 tablet by mouth every 8 (eight) hours as needed for pain.   5 tablet   0    BP 125/78  Pulse 78  Temp(Src) 98 F (36.7 C) (Oral)  Resp 20  Wt 240 lb (108.863 kg)  BMI 41.18 kg/m2  SpO2 94% Physical Exam  Constitutional: She is oriented to person, place, and time. She appears well-developed and well-nourished.  HENT:  Head: Normocephalic.  Neck: Normal range of motion. Neck supple.  Cardiovascular: Normal rate and regular rhythm.  Pulmonary/Chest: Effort normal and breath sounds normal.  Abdominal: Soft. Bowel sounds are normal. There is no tenderness. There is no rebound and no guarding.  Musculoskeletal: Normal range of motion. She exhibits no edema.  Neurological: She is alert and oriented to person, place, and time.  Skin: Skin is warm and dry. No rash noted.  Psychiatric: She has a normal mood and affect.    ED Course  Procedures (including critical care time) Labs Reviewed  BASIC METABOLIC PANEL - Abnormal; Notable for the following:    Glucose, Bld 128 (*)    GFR calc non Af Amer 71 (*)    GFR calc Af Amer 83 (*)    All other components within normal limits  CBC  POCT I-STAT TROPONIN I   No results found. 1. Costochondritis   2. Chest pain   3. HTN (hypertension)     MDM  Cardiac evaluation negative, pain constant, atypical for ACS.   Arnoldo Hooker, PA-C 02/28/13 1108

## 2013-03-03 NOTE — ED Provider Notes (Signed)
Medical screening examination/treatment/procedure(s) were performed by non-physician practitioner and as supervising physician I was immediately available for consultation/collaboration.   Carleene Cooper III, MD 03/03/13 [phone]

## 2014-06-04 ENCOUNTER — Ambulatory Visit (INDEPENDENT_AMBULATORY_CARE_PROVIDER_SITE_OTHER): Payer: BC Managed Care – PPO | Admitting: Physician Assistant

## 2014-06-04 VITALS — BP 128/82 | HR 89 | Temp 98.1°F | Resp 18 | Ht 64.5 in | Wt 241.6 lb

## 2014-06-04 DIAGNOSIS — J069 Acute upper respiratory infection, unspecified: Secondary | ICD-10-CM

## 2014-06-04 DIAGNOSIS — R059 Cough, unspecified: Secondary | ICD-10-CM

## 2014-06-04 DIAGNOSIS — R05 Cough: Secondary | ICD-10-CM

## 2014-06-04 MED ORDER — BENZONATATE 100 MG PO CAPS: 100.0000 mg | ORAL_CAPSULE | Freq: Three times a day (TID) | ORAL | Status: DC | PRN

## 2014-06-04 MED ORDER — HYDROCOD POLST-CHLORPHEN POLST 10-8 MG/5ML PO LQCR: 5.0000 mL | Freq: Two times a day (BID) | ORAL | Status: DC | PRN

## 2014-06-04 NOTE — Progress Notes (Signed)
I was directly involved with the patient's care and agree with the physical, diagnosis and treatment plan.  

## 2014-06-04 NOTE — Progress Notes (Signed)
Subjective:    Patient ID: Jamie PaliAngela D Bufano, female    DOB: 08/23/1961, 53 y.o.   MRN: [id number]  HPI Patient presents to clinic for 4 days of hoarseness and feeling like there is mucus stuck in her throat. Cough began 2 days ago and keeps her up at night. Denies sore throat, rhinorrhea, sinus or ear pressure. Hx of seasonal allergies and exercise induced asthma that are not currently an issue. Has tried Mucinex max and sinus tablets x 3 days without relief. 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 Says this does not feel the same and she has not had reflux for over 2 weeks. Received flu shot yesterday (06/03/14).  Review of Systems  Constitutional: Positive for fatigue. Negative for fever, activity change and appetite change.  HENT: Negative for congestion, ear discharge, ear pain, postnasal drip, rhinorrhea, sinus pressure, sneezing, sore throat and trouble swallowing.   Eyes: Negative for pain, discharge and itching.  Respiratory: Positive for cough (nonproductive). Negative for chest tightness, shortness of breath and wheezing.   Cardiovascular: Negative for chest pain and palpitations.  Gastrointestinal: Negative for nausea, vomiting, abdominal pain, diarrhea and constipation.  Genitourinary: Negative.   Musculoskeletal: Negative for myalgias, neck pain and neck stiffness.  Allergic/Immunologic: Negative for environmental allergies and food allergies.  Neurological: Negative for dizziness, light-headedness and headaches.  Hematological: Negative for adenopathy.       Objective:   Physical Exam  Constitutional: She is oriented to person, place, and time. She appears well-developed and well-nourished. No distress.  HENT:  Head: Normocephalic and atraumatic.  Right Ear: Tympanic membrane, external ear and ear canal normal.  Left Ear: Tympanic membrane, external ear and ear canal normal.  Nose: Rhinorrhea (mild) present. No mucosal edema or  sinus tenderness. Right sinus exhibits no maxillary sinus tenderness and no frontal sinus tenderness. Left sinus exhibits no maxillary sinus tenderness and no frontal sinus tenderness.  Mouth/Throat: Oropharynx is clear and moist. Mucous membranes are not pale and not dry. No oropharyngeal exudate, posterior oropharyngeal edema or posterior oropharyngeal erythema.  Eyes: Conjunctivae are normal. Pupils are equal, round, and reactive to light. Right eye exhibits no discharge. Left eye exhibits no discharge.  Neck: Neck supple. No thyromegaly present.  Cardiovascular: Normal rate, regular rhythm and normal heart sounds.  Exam reveals no gallop and no friction rub.   No murmur heard. Pulmonary/Chest: Effort normal and breath sounds normal. She has no wheezes. She has no rales. She exhibits no tenderness.  Abdominal: Soft. Bowel sounds are normal. There is no tenderness.  Lymphadenopathy:    She has no cervical adenopathy.  Neurological: She is alert and oriented to person, place, and time.  Skin: Skin is warm and dry. No rash noted. She is not diaphoretic. No erythema. No pallor.   Blood pressure 128/82, pulse 89, temperature 98.1 F (36.7 C), temperature source Oral, resp. rate 18, height 5' 4.5" (1.638 m), weight 241 lb 9.6 oz (109.589 kg), SpO2 99.00%.      Assessment & Plan:  1. Acute upper respiratory infection 2. Cough - chlorpheniramine-HYDROcodone (TUSSIONEX PENNKINETIC ER) 10-8 MG/5ML LQCR; Take 5 mLs by mouth every 12 (twelve) hours as needed for cough (cough).  Dispense: 90 mL; Refill: 0 - benzonatate (TESSALON) 100 MG capsule; Take 1-2 capsules (100-200 mg total) by mouth 3 (three) times daily as needed for cough.  Dispense: 40 capsule; Refill: 0 - Encouraged to get plenty of rest and fluids and continue  Mucinex. - RTC if sx do not improve in a week for evaluation for GERD and start PPI.   Janan Ridge PA-C  [facility] 06/04/2014 10:21 AM

## 2014-06-04 NOTE — Patient Instructions (Signed)
If in a week hoarseness, has improved please RTC for evaluation for GERD and starting of a PPI medication.

## 2016-10-29 ENCOUNTER — Ambulatory Visit (HOSPITAL_COMMUNITY)
Admission: EM | Admit: 2016-10-29 | Discharge: 2016-10-29 | Disposition: A | Discharge status: Home / Self Care | Payer: Self-pay | Attending: Family Medicine | Admitting: Family Medicine

## 2016-10-29 ENCOUNTER — Encounter (HOSPITAL_COMMUNITY): Payer: Self-pay | Admitting: *Deleted

## 2016-10-29 DIAGNOSIS — Z23 Encounter for immunization: Secondary | ICD-10-CM

## 2016-10-29 DIAGNOSIS — S65511A Laceration of blood vessel of left index finger, initial encounter: Secondary | ICD-10-CM

## 2016-10-29 MED ORDER — POVIDONE-IODINE 10 % EX SOLN
CUTANEOUS | Status: AC
  Filled 2016-10-29: qty 118

## 2016-10-29 MED ORDER — TETANUS-DIPHTH-ACELL PERTUSSIS 5-2.5-18.5 LF-MCG/0.5 IM SUSP
INTRAMUSCULAR | Status: AC
  Filled 2016-10-29: qty 0.5

## 2016-10-29 MED ORDER — TETANUS-DIPHTH-ACELL PERTUSSIS 5-2.5-18.5 LF-MCG/0.5 IM SUSP
0.5000 mL | Freq: Once | INTRAMUSCULAR | Status: AC
  Administered 2016-10-29: 0.5 mL via INTRAMUSCULAR

## 2016-10-29 NOTE — ED Provider Notes (Signed)
[facility]    CSN: [id number] Arrival date & time: 10/29/16  1813     History   Chief Complaint Chief Complaint  Patient presents with  . Laceration    HPI [name] is a 56 y.o. female.   This is a 56 year old woman who comes in after cutting her left pointer finger when she was chopping vegetables this evening. She recently underwent hysterectomy which was uncomplicated. She is unsure when her last technical shot was.      Past Medical History:  Diagnosis Date  . Coronary artery disease   . Hypertension   . Thyroid disease     Patient Active Problem List   Diagnosis Date Noted  . Obesity, Class II, BMI 35-39.9 04/29/2012  . Post traumatic stress disorder (PTSD) 04/29/2012  . Stress at home 04/29/2012  . HTN (hypertension) 04/29/2012  . Hypothyroidism 04/29/2012  . Moderate obstructive sleep apnea 04/29/2012    Past Surgical History:  Procedure Laterality Date  . TUBAL LIGATION      OB History    No data available       Home Medications    Prior to Admission medications   Medication Sig Start Date End Date Taking? Authorizing Provider  benzonatate (TESSALON) 100 MG capsule Take 1-2 capsules (100-200 mg total) by mouth 3 (three) times daily as needed for cough. 06/04/14   Tishira R Brewington, PA-C  carvedilol (COREG) 25 MG tablet Take 25 mg by mouth 2 (two) times daily with a meal.      Historical Provider, MD  chlorpheniramine-HYDROcodone (TUSSIONEX PENNKINETIC ER) 10-8 MG/5ML LQCR Take 5 mLs by mouth every 12 (twelve) hours as needed for cough (cough). 06/04/14   Tishira R Brewington, PA-C  cholecalciferol (VITAMIN D) 1000 UNITS tablet Take 1,000 Units by mouth daily.      Historical Provider, MD  cyclobenzaprine (FLEXERIL) 10 MG tablet Take 1 tablet (10 mg total) by mouth 2 (two) times daily as needed for muscle spasms. 02/06/13   Marissa Sciacca, PA-C  HYDROcodone-acetaminophen (NORCO) 5-325 MG per tablet Take 1 tablet by mouth every 8  (eight) hours as needed for pain. 02/06/13   Marissa Sciacca, PA-C  levothyroxine (SYNTHROID, LEVOTHROID) 50 MCG tablet Take 50 mcg by mouth daily.      Historical Provider, MD  venlafaxine XR (EFFEXOR-XR) 75 MG 24 hr capsule Take 75 mg by mouth at bedtime.    Historical Provider, MD    Family History Family History  Problem Relation Age of Onset  . Hypertension Father     Social History Social History  Substance Use Topics  . Smoking status: Never Smoker  . Smokeless tobacco: Never Used  . Alcohol use No     Allergies   Penicillins   Review of Systems Review of Systems  Constitutional: Negative.   HENT: Negative.   Respiratory: Negative.   Skin: Positive for wound.     Physical Exam Triage Vital Signs ED Triage Vitals [10/29/16 1846]  Enc Vitals Group     BP      Pulse      Resp      Temp      Temp src      SpO2      Weight      Height      Head Circumference      Peak Flow      Pain Score 2     Pain Loc      Pain Edu?  Excl. in GC?    No data found.   Updated Vital Signs BP 130/70 (BP Location: Right Arm)   Pulse 72   Temp 98.6 F (37 C) (Oral)   SpO2 100%    Physical Exam  Constitutional: She appears well-developed and well-nourished.  HENT:  Right Ear: External ear normal.  Left Ear: External ear normal.  Mouth/Throat: Oropharynx is clear and moist.  Eyes: Conjunctivae are normal. Pupils are equal, round, and reactive to light.  Neck: Normal range of motion.  Pulmonary/Chest: Effort normal.  Musculoskeletal: Normal range of motion.  Neurological: She is alert.  Skin: Skin is warm and dry.  7 mm laceration on the radial side of the distal phalanx of her left pointer finger.  Nursing note and vitals reviewed.    UC Treatments / Results  Labs (all labs ordered are listed, but only abnormal results are displayed) Labs Reviewed - No data to display  EKG  EKG Interpretation None       Radiology No results  found.  Procedures .Marland KitchenLaceration Repair Date/Time: 10/29/2016 6:53 PM Performed by: Elvina Sidle Authorized by: Elvina Sidle   Consent:    Consent obtained:  Verbal   Consent given by:  Patient   Risks discussed:  Pain   Alternatives discussed:  No treatment Anesthesia (see MAR for exact dosages):    Anesthesia method:  None Laceration details:    Location:  Finger   Finger location:  L index finger   Length (cm):  0.7   Depth (mm):  0.4 Repair type:    Repair type:  Simple Pre-procedure details:    Preparation:  Patient was prepped and draped in usual sterile fashion Exploration:    Hemostasis achieved with:  Direct pressure   Wound extent: no nerve damage noted and no tendon damage noted     Contaminated: no   Treatment:    Area cleansed with:  Betadine   Visualized foreign bodies/material removed: no   Skin repair:    Repair method:  Tissue adhesive Approximation:    Approximation:  Close   Vermilion border: well-aligned   Post-procedure details:    Dressing:  Bulky dressing   Patient tolerance of procedure:  Tolerated well, no immediate complications   (including critical care time)  Medications Ordered in UC Medications  Tdap (BOOSTRIX) injection 0.5 mL (0.5 mLs Intramuscular Given 10/29/16 1853)     Initial Impression / Assessment and Plan / UC Course  I have reviewed the triage vital signs and the nursing notes.  Pertinent labs & imaging results that were available during my care of the patient were reviewed by me and considered in my medical decision making (see chart for details).     Final Clinical Impressions(s) / UC Diagnoses   Final diagnoses:  Laceration of blood vessel of left index finger, initial encounter    New Prescriptions New Prescriptions   No medications on file     Elvina Sidle, MD 10/29/16 1856

## 2016-10-29 NOTE — ED Triage Notes (Signed)
Lac  To     l   Pointer      With  A  Sharp  Knife      Bleeding  Subsided

## 2016-10-29 NOTE — Discharge Instructions (Signed)
The tissue adhesive usually lasts about 4 days. You may get the wound wet. Generally the adhesive starts flaking off after 3-4 days.

## 2020-05-22 ENCOUNTER — Other Ambulatory Visit: Payer: Self-pay

## 2020-05-22 ENCOUNTER — Ambulatory Visit (INDEPENDENT_AMBULATORY_CARE_PROVIDER_SITE_OTHER): Payer: Non-veteran care

## 2020-05-22 ENCOUNTER — Encounter (HOSPITAL_COMMUNITY): Payer: Self-pay

## 2020-05-22 ENCOUNTER — Ambulatory Visit (HOSPITAL_COMMUNITY)
Admission: EM | Admit: 2020-05-22 | Discharge: 2020-05-22 | Disposition: A | Discharge status: Home / Self Care | Payer: Non-veteran care | Attending: Internal Medicine | Admitting: Internal Medicine

## 2020-05-22 DIAGNOSIS — M25532 Pain in left wrist: Secondary | ICD-10-CM

## 2020-05-22 DIAGNOSIS — S62102A Fracture of unspecified carpal bone, left wrist, initial encounter for closed fracture: Secondary | ICD-10-CM

## 2020-05-22 MED ORDER — HYDROCODONE-ACETAMINOPHEN 5-325 MG PO TABS: 1.0000 | ORAL_TABLET | Freq: Four times a day (QID) | ORAL | 0 refills | Status: AC | PRN

## 2020-05-22 NOTE — ED Triage Notes (Signed)
Pt states she tripped and fell and her left wrist hit the curb. Pt denies hitting head or LOC. Pt has 3+ swelling of left wrist, 2+ left radial pulse, cap refill less than 3 sec, 2/5 left grip strength.

## 2020-05-22 NOTE — Discharge Instructions (Addendum)
RICE: rest, ice, compression, elevation as needed for pain.   Cold therapy (ice packs) can be used to help swelling both after injury and after prolonged use of areas of chronic pain/aches.  Important to follow up with specialist(s) below for further evaluation/management if your symptoms persist or worsen. 

## 2020-05-22 NOTE — Progress Notes (Signed)
Orthopedic Tech Progress Note Patient Details:  LEIGH BLAS 08-31-1960 [id number]  Ortho Devices Type of Ortho Device: Sugartong splint, Sling immobilizer Ortho Device/Splint Location: Left Upper Extremity Ortho Device/Splint Interventions: Ordered, Application   Post Interventions Patient Tolerated: Well Instructions Provided: Adjustment of device, Care of device, Poper ambulation with device   Gerald Stabs 05/22/2020, 6:46 PM

## 2020-05-22 NOTE — ED Provider Notes (Signed)
[facility]    CSN: [id number] Arrival date & time: 05/22/20  1700      History   Chief Complaint Chief Complaint  Patient presents with  . Wrist Injury    HPI [name] is a 59 y.o. female  Presented for left wrist pain, swelling, decreased ROM s/p fall.  Patient provides history: States she fell while walking outside on her wrist.  No head trauma, LOC.  Did have emesis second pain.  Denies chest pain, palpitations, difficulty breathing, numbness or deformity.  Does have difficulty moving fingers.  Past Medical History:  Diagnosis Date  . Coronary artery disease   . Hypertension   . Thyroid disease     Patient Active Problem List   Diagnosis Date Noted  . Obesity, Class II, BMI 35-39.9 04/29/2012  . Post traumatic stress disorder (PTSD) 04/29/2012  . Stress at home 04/29/2012  . HTN (hypertension) 04/29/2012  . Hypothyroidism 04/29/2012  . Moderate obstructive sleep apnea 04/29/2012    Past Surgical History:  Procedure Laterality Date  . ABDOMINAL HYSTERECTOMY    . BREAST BIOPSY    . TUBAL LIGATION      OB History   No obstetric history on file.      Home Medications    Prior to Admission medications   Medication Sig Start Date End Date Taking? Authorizing Provider  carvedilol (COREG) 25 MG tablet Take 25 mg by mouth 2 (two) times daily with a meal.      [provider]  cholecalciferol (VITAMIN D) 1000 UNITS tablet Take 1,000 Units by mouth daily.      [provider]  HYDROcodone-acetaminophen (NORCO/VICODIN) 5-325 MG tablet Take 1 tablet by mouth every 6 (six) hours as needed. 05/22/20   Hall-Potvin, [place], PA-C  levothyroxine (SYNTHROID, LEVOTHROID) 50 MCG tablet Take 50 mcg by mouth daily.      [provider]    Family History Family History  Problem Relation Age of Onset  . Hypertension Father     Social History Social History   Tobacco Use  . Smoking status: Never Smoker  . Smokeless  tobacco: Never Used  Substance Use Topics  . Alcohol use: No  . Drug use: No     Allergies   Penicillins   Review of Systems As per HPI   Physical Exam Triage Vital Signs ED Triage Vitals  Enc Vitals Group     BP      Pulse      Resp      Temp      Temp src      SpO2      Weight      Height      Head Circumference      Peak Flow      Pain Score      Pain Loc      Pain Edu?      Excl. in GC?    No data found.  Updated Vital Signs BP (!) 152/83   Pulse 90   Temp 98.1 F (36.7 C) (Oral)   Resp 18   Ht 5\' 5"  (1.651 m)   Wt 227 lb (103 kg)   SpO2 100%   BMI 37.77 kg/m   Visual Acuity Right Eye Distance:   Left Eye Distance:   Bilateral Distance:    Right Eye Near:   Left Eye Near:    Bilateral Near:     Physical Exam Constitutional:      General:  She is not in acute distress. HENT:     Head: Normocephalic and atraumatic.  Eyes:     General: No scleral icterus.    Pupils: Pupils are equal, round, and reactive to light.  Cardiovascular:     Rate and Rhythm: Normal rate.  Pulmonary:     Effort: Pulmonary effort is normal.  Musculoskeletal:        General: Swelling and tenderness present.     Comments: Left wrist with decreased extension secondary pain.  Does have knot over distal radius.  Able to move all fingers.  Neurovascular intact.  Skin:    Coloration: Skin is not jaundiced or pale.  Neurological:     Mental Status: She is alert and oriented to person, place, and time.      UC Treatments / Results  Labs (all labs ordered are listed, but only abnormal results are displayed) Labs Reviewed - No data to display  EKG   Radiology DG Wrist Complete Left  Result Date: 05/22/2020 CLINICAL DATA:  Pain EXAM: LEFT WRIST - COMPLETE 3+ VIEW COMPARISON:  February 17, 2009 FINDINGS: There is an acute, comminuted and intra-articular fracture of the distal radius. There is extensive surrounding soft tissue swelling. There is no dislocation. There are  degenerative changes of the first [facility]. IMPRESSION: Acute, comminuted and intra-articular fracture of the distal radius. Electronically Signed   By: Katherine Mantle M.D.   On: 05/22/2020 17:53    Procedures Procedures (including critical care time)  Medications Ordered in UC Medications - No data to display  Initial Impression / Assessment and Plan / UC Course  I have reviewed the triage vital signs and the nursing notes.  Pertinent labs & imaging results that were available during my care of the patient were reviewed by me and considered in my medical decision making (see chart for details).     XR significant for acute, comminuted intra-articular fracture of the distal radius.  Orthotec placed sugar tong which patient tolerated well.  Provided orthopedic follow-up information for 1 week.  Return precautions discussed, pt verbalized understanding and is agreeable to plan. Final Clinical Impressions(s) / UC Diagnoses   Final diagnoses:  Closed fracture of left wrist, initial encounter     Discharge Instructions     RICE: rest, ice, compression, elevation as needed for pain.   Cold therapy (ice packs) can be used to help swelling both after injury and after prolonged use of areas of chronic pain/aches.  Important to follow up with specialist(s) below for further evaluation/management if your symptoms persist or worsen.    ED Prescriptions    Medication Sig Dispense Auth. Provider   HYDROcodone-acetaminophen (NORCO/VICODIN) 5-325 MG tablet Take 1 tablet by mouth every 6 (six) hours as needed. 8 tablet Hall-Potvin, [place], PA-C     I have reviewed the PDMP during this encounter.   Hall-Potvin, [place], [place] 05/23/20 [phone]

## 2020-05-23 ENCOUNTER — Encounter (HOSPITAL_COMMUNITY): Payer: Self-pay | Admitting: Emergency Medicine

## 2020-05-27 ENCOUNTER — Other Ambulatory Visit: Payer: Self-pay

## 2020-05-27 ENCOUNTER — Other Ambulatory Visit: Payer: Non-veteran care

## 2020-05-27 ENCOUNTER — Ambulatory Visit (HOSPITAL_COMMUNITY)
Admission: RE | Admit: 2020-05-27 | Discharge: 2020-05-27 | Disposition: A | Discharge status: Home / Self Care | Payer: No Typology Code available for payment source | Source: Ambulatory Visit | Attending: Orthopedic Surgery | Admitting: Orthopedic Surgery

## 2020-05-27 ENCOUNTER — Other Ambulatory Visit: Payer: Self-pay | Admitting: Orthopedic Surgery

## 2020-05-27 DIAGNOSIS — S52502A Unspecified fracture of the lower end of left radius, initial encounter for closed fracture: Secondary | ICD-10-CM

## 2021-04-18 IMAGING — CT CT WRIST*L* W/O CM
3 of 4 series · 10 of 35 positions shown, 12 images · non-contrast
Comparison: RADIOGRAPHS 05/22/2020

CLINICAL DATA: Evaluate left wrist fracture. Nonspecific (abnormal)
findings on radiological and other examination of musculoskeletal
sysem.

EXAM:
CT OF THE LEFT WRIST WITHOUT CONTRAST, CT THREE-DIMENSIONAL CT IMAGE
RENDERING ON ACQUISITION WORKSTATION.
TECHNIQUE: Multidetector CT imaging of the upper left extremity was performed
according to the standard protocol.

[Series 4: ax bone · axial · 0.22mm/px · z∈[+1267,+1316]mm · 2 of 82 slices shown, 3 images]
[im 28/82  soft-tissue]
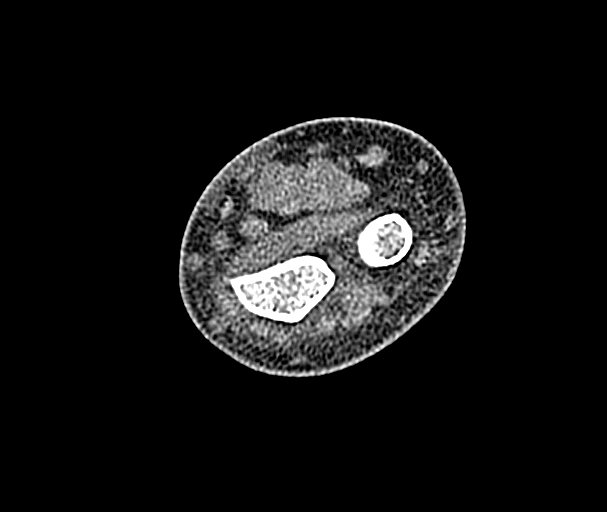
[im 28/82  bone]
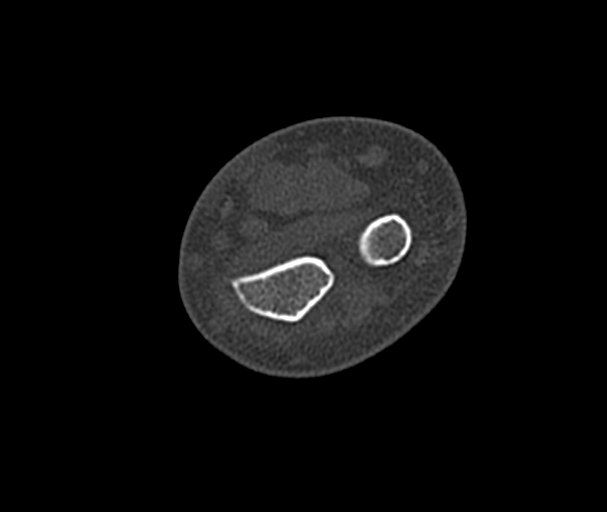
[im 55/82  bone]
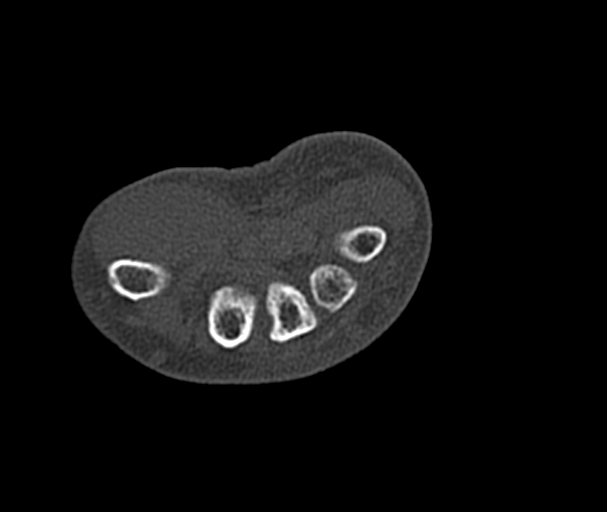

[Series 9: cor st · coronal · 0.22mm/px · 3 of 37 slices shown]
[im 8/37  bone]
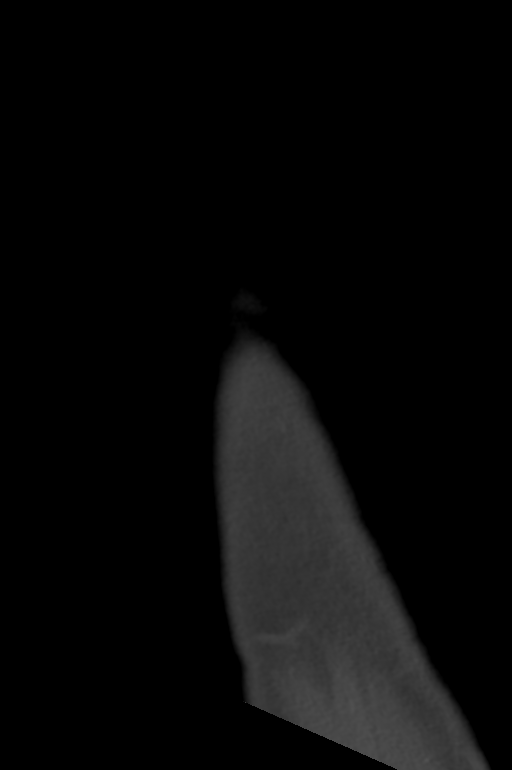
[im 15/37  bone]
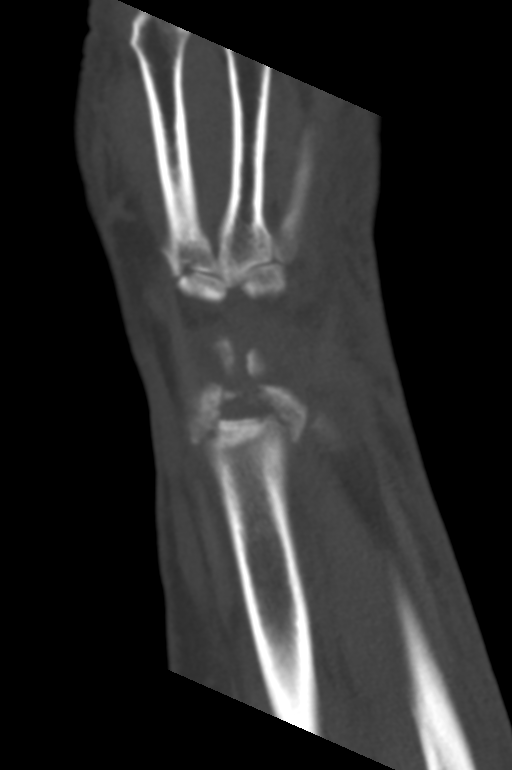
[im 22/37  bone]
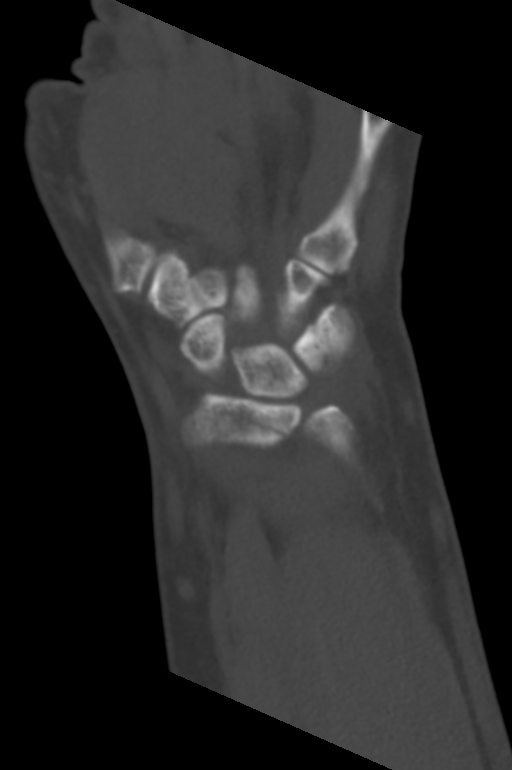

[Series 10: sag st · sagittal · 0.18mm/px · 5 of 47 slices shown, 6 images]
[im 16/47  bone]
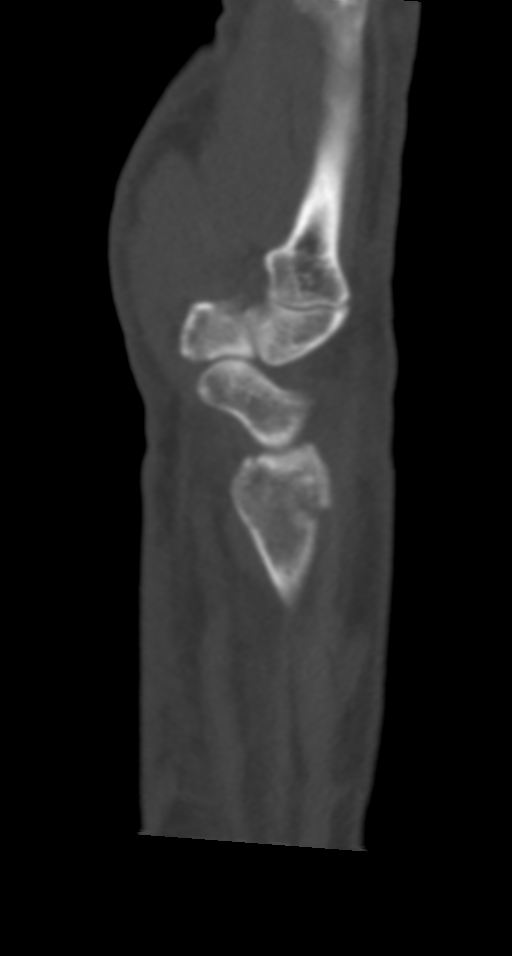
[im 20/47  bone]
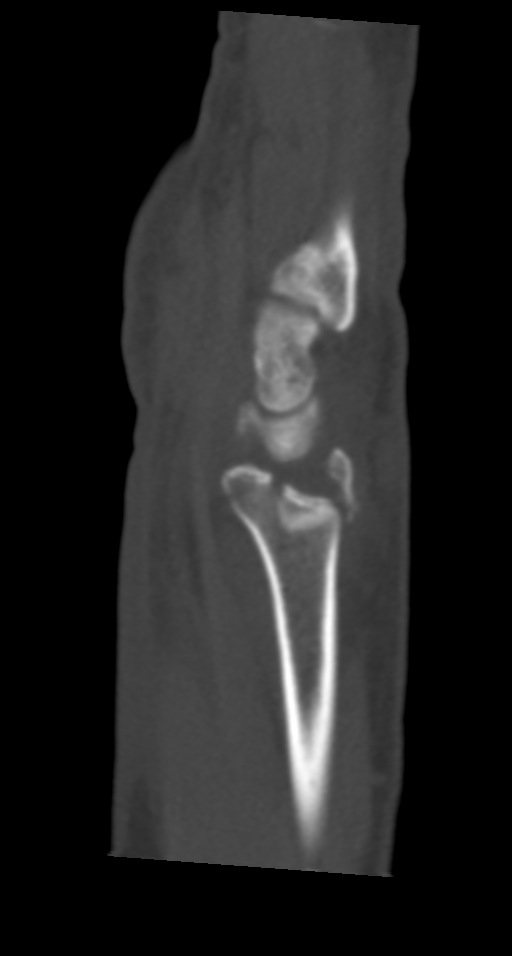
[im 24/47  soft-tissue]
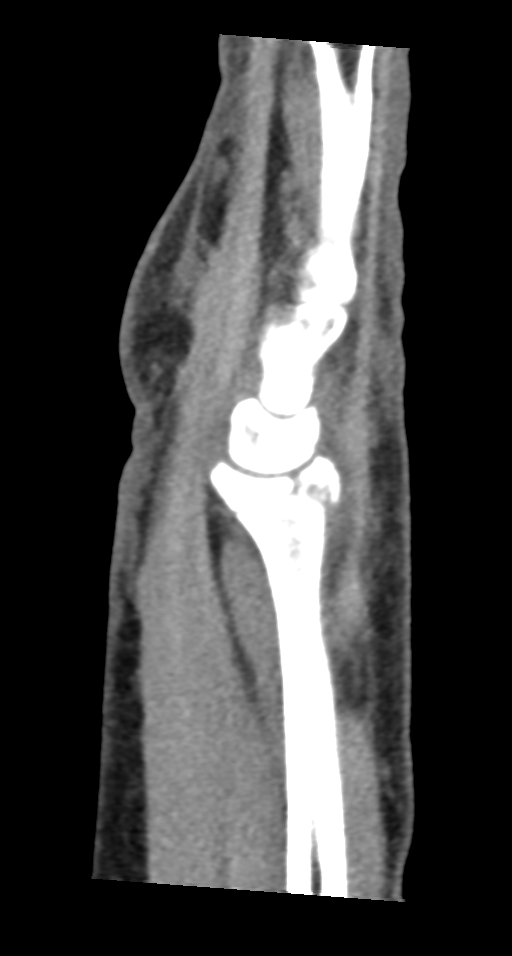
[im 24/47  bone]
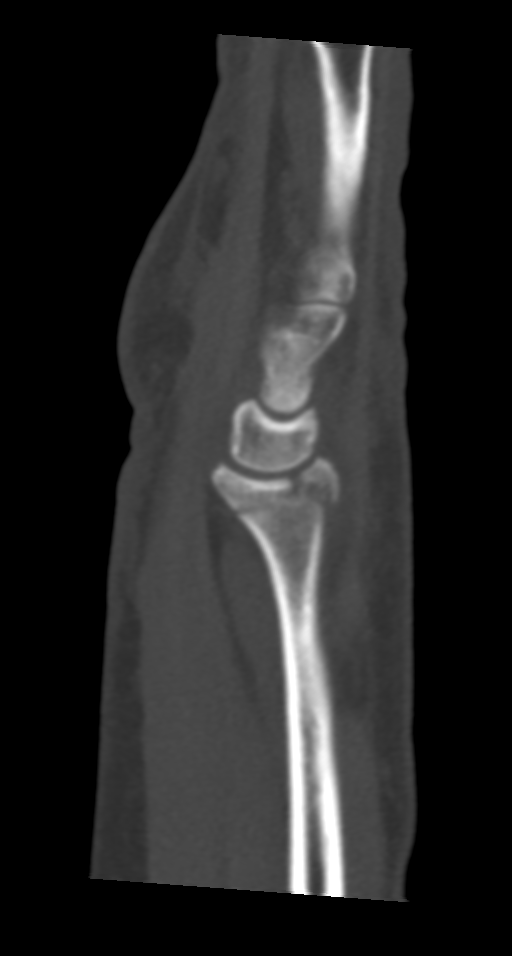
[im 27/47  bone]
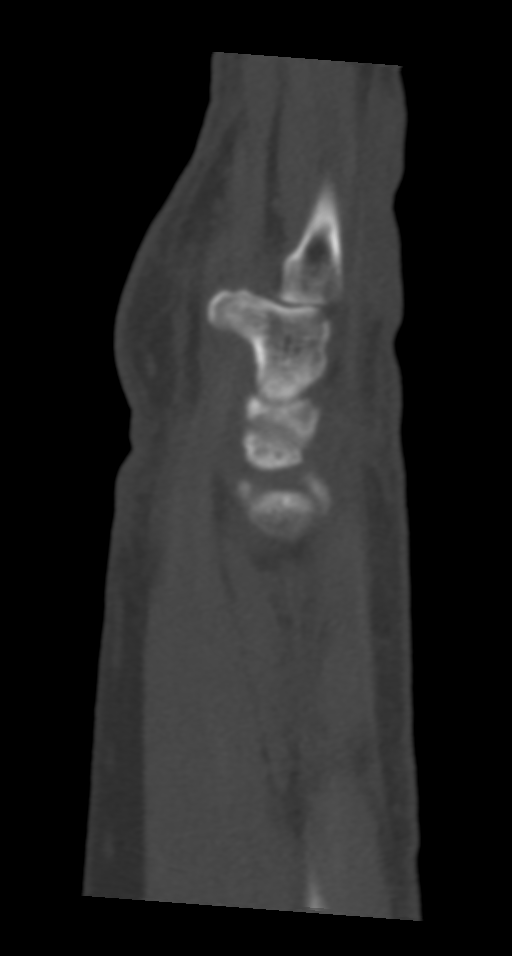
[im 31/47  bone]
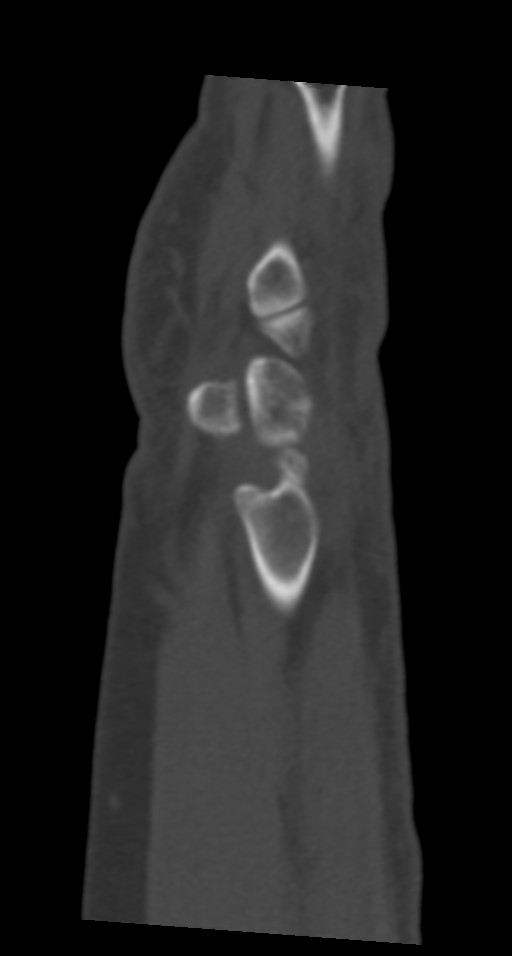

[10 of 35 positions shown; findings below may reference images not displayed]

FINDINGS: Complex comminuted intra-articular fracture of the distal radius.
This has a die punch type appearance with a centrally depressed
fragment. Maximum depression is approximately 4 mm. The depressed
segment measures 12 x 8 mm. The fracture then extends out obliquely
through both the radial and ulnar cortices of the radius.

Small ulnar styloid avulsion fracture noted.

The carpal bones are intact. Slight widening of the scapholunate
joint space is noted and could suggest a ligamentous injury. The
visualized metacarpal bones are intact.

Joint effusion and surrounding inflammation/edema but no obvious
muscle or tendon injury.
IMPRESSION: 1. Complex comminuted die punch type intra-articular fracture of the
distal radius as described above.
2. Small ulnar styloid avulsion fracture.
3. Slight widening of the scapholunate joint space could suggest a
ligamentous injury.
4. Joint effusion and surrounding inflammation/edema but no obvious
muscle or tendon injury.

## 2022-05-11 ENCOUNTER — Encounter (HOSPITAL_COMMUNITY): Payer: Self-pay | Admitting: Emergency Medicine

## 2022-05-11 ENCOUNTER — Ambulatory Visit (HOSPITAL_COMMUNITY): Payer: No Typology Code available for payment source

## 2022-05-11 ENCOUNTER — Emergency Department (HOSPITAL_COMMUNITY)
Admission: EM | Admit: 2022-05-11 | Discharge: 2022-05-11 | Discharge status: Against Medical Advice | Payer: No Typology Code available for payment source | Attending: Emergency Medicine | Admitting: Emergency Medicine

## 2022-05-11 DIAGNOSIS — M79662 Pain in left lower leg: Secondary | ICD-10-CM | POA: Insufficient documentation

## 2022-05-11 DIAGNOSIS — Z5321 Procedure and treatment not carried out due to patient leaving prior to being seen by health care provider: Secondary | ICD-10-CM | POA: Insufficient documentation

## 2022-05-11 DIAGNOSIS — M7989 Other specified soft tissue disorders: Secondary | ICD-10-CM | POA: Diagnosis not present

## 2022-05-11 LAB — CBC WITH DIFFERENTIAL/PLATELET
Abs Immature Granulocytes: 0.02 10*3/uL (ref 0.00–0.07)
Basophils Absolute: 0 10*3/uL (ref 0.0–0.1)
Basophils Relative: 0 %
Eosinophils Absolute: 0.1 10*3/uL (ref 0.0–0.5)
Eosinophils Relative: 2 %
HCT: 35.7 % — ABNORMAL LOW (ref 36.0–46.0)
Hemoglobin: 11.7 g/dL — ABNORMAL LOW (ref 12.0–15.0)
Immature Granulocytes: 0 %
Lymphocytes Relative: 42 %
Lymphs Abs: 3.1 10*3/uL (ref 0.7–4.0)
MCH: 30.8 pg (ref 26.0–34.0)
MCHC: 32.8 g/dL (ref 30.0–36.0)
MCV: 93.9 fL (ref 80.0–100.0)
Monocytes Absolute: 0.4 10*3/uL (ref 0.1–1.0)
Monocytes Relative: 6 %
Neutro Abs: 3.6 10*3/uL (ref 1.7–7.7)
Neutrophils Relative %: 50 %
Platelets: 231 10*3/uL (ref 150–400)
RBC: 3.8 MIL/uL — ABNORMAL LOW (ref 3.87–5.11)
RDW: 13.3 % (ref 11.5–15.5)
WBC: 7.3 10*3/uL (ref 4.0–10.5)
nRBC: 0 % (ref 0.0–0.2)

## 2022-05-11 LAB — BASIC METABOLIC PANEL
Anion gap: 6 (ref 5–15)
BUN: 19 mg/dL (ref 6–20)
CO2: 26 mmol/L (ref 22–32)
Calcium: 9.5 mg/dL (ref 8.9–10.3)
Chloride: 110 mmol/L (ref 98–111)
Creatinine, Ser: 0.87 mg/dL (ref 0.44–1.00)
GFR, Estimated: >60 mL/min (ref >60)
Glucose, Bld: 86 mg/dL (ref 70–99)
Potassium: 4.3 mmol/L (ref 3.5–5.1)
Sodium: 142 mmol/L (ref 135–145)

## 2022-05-11 NOTE — Progress Notes (Signed)
Lower extremity venous attempted. Triage staff called for patient in waiting area x3 between 1930 and 1945. No response.   Jean Rosenthal, RDMS, RVT

## 2022-05-11 NOTE — ED Provider Triage Note (Signed)
Emergency Medicine Provider Triage Evaluation Note  [name] , a 61 y.o. female  was evaluated in triage.  Pt complains of leg pain and swelling. Located in left leg, medial aspect of lower calf extending down to calf. Noticed it last Tuesday. Pain and swelling getting worse. Now limping while walking. Denies bug bite. Denies trauma. Denies CP or shortness of breath. Denies travel.   H/o sleeve gastroectomy.  Review of Systems  Positive: See above Negative: See above  Physical Exam  BP (!) 141/90 (BP Location: Left Arm)   Pulse 87   Temp 98 F (36.7 C) (Oral)   Resp 16   SpO2 97%  Gen:   Awake, no distress   Resp:  Normal effort  MSK:   Moves extremities without difficulty. Left leg swollen and red. Lower left calf tenderness. DP +2 bilaterally Other:    Medical Decision Making  Medically screening exam initiated at 4:46 PM.  Appropriate orders placed.  Philmore Pali was informed that the remainder of the evaluation will be completed by another provider, this initial triage assessment does not replace that evaluation, and the importance of remaining in the ED until their evaluation is complete.  Plan U/s left leg   Gareth Eagle, PA-C 05/11/22 1651

## 2022-05-11 NOTE — ED Triage Notes (Signed)
Pt here from home with c/o left leg swollen and redness that started 2 days ago , no fevers ,

## 2024-03-14 ENCOUNTER — Encounter: Payer: Self-pay | Admitting: Advanced Practice Midwife
